# Patient Record
Sex: Male | Born: 1960 | Race: White | Hispanic: No | Marital: Single | Smoking: Current every day smoker
Health system: Southern US, Community
[De-identification: ages and names within clinical notes are randomized; demographics above are authoritative.]

## PROBLEM LIST (undated history)

## (undated) DIAGNOSIS — M109 Gout, unspecified: Secondary | ICD-10-CM

## (undated) DIAGNOSIS — I10 Essential (primary) hypertension: Secondary | ICD-10-CM

## (undated) HISTORY — PX: FOOT SURGERY: SHX648

---

## 2012-12-24 ENCOUNTER — Emergency Department: Payer: Self-pay | Admitting: Emergency Medicine

## 2013-06-21 ENCOUNTER — Emergency Department: Payer: Self-pay | Admitting: Emergency Medicine

## 2013-10-03 ENCOUNTER — Emergency Department: Payer: Self-pay | Admitting: Internal Medicine

## 2014-04-04 ENCOUNTER — Emergency Department: Payer: Self-pay | Admitting: Emergency Medicine

## 2014-04-04 LAB — CBC
HCT: 47 % (ref 40.0–52.0)
HGB: 15.5 g/dL (ref 13.0–18.0)
MCH: 28.7 pg (ref 26.0–34.0)
MCHC: 33.1 g/dL (ref 32.0–36.0)
MCV: 87 fL (ref 80–100)
Platelet: 195 10*3/uL (ref 150–440)
RBC: 5.41 10*6/uL (ref 4.40–5.90)
RDW: 13.3 % (ref 11.5–14.5)
WBC: 11 10*3/uL — AB (ref 3.8–10.6)

## 2014-04-04 LAB — COMPREHENSIVE METABOLIC PANEL
ALK PHOS: 60 U/L
Albumin: 3.7 g/dL (ref 3.4–5.0)
Anion Gap: 6 — ABNORMAL LOW (ref 7–16)
BUN: 9 mg/dL (ref 7–18)
Bilirubin,Total: 0.8 mg/dL (ref 0.2–1.0)
CHLORIDE: 106 mmol/L (ref 98–107)
CO2: 26 mmol/L (ref 21–32)
CREATININE: 1.1 mg/dL (ref 0.60–1.30)
Calcium, Total: 9.1 mg/dL (ref 8.5–10.1)
EGFR (African American): >60
EGFR (Non-African Amer.): >60
Glucose: 164 mg/dL — ABNORMAL HIGH (ref 65–99)
OSMOLALITY: 278 (ref 275–301)
Potassium: 3.9 mmol/L (ref 3.5–5.1)
SGOT(AST): 41 U/L — ABNORMAL HIGH (ref 15–37)
SGPT (ALT): 61 U/L (ref 12–78)
Sodium: 138 mmol/L (ref 136–145)
Total Protein: 7.1 g/dL (ref 6.4–8.2)

## 2014-04-06 ENCOUNTER — Emergency Department: Payer: Self-pay | Admitting: Emergency Medicine

## 2014-04-09 LAB — CULTURE, BLOOD (SINGLE)

## 2014-04-11 ENCOUNTER — Emergency Department: Payer: Self-pay | Admitting: Emergency Medicine

## 2015-02-14 ENCOUNTER — Emergency Department: Admit: 2015-02-14 | Disposition: A | Discharge status: Home / Self Care | Payer: Self-pay

## 2015-04-02 ENCOUNTER — Encounter: Payer: Self-pay | Admitting: Emergency Medicine

## 2015-04-02 ENCOUNTER — Emergency Department: Payer: Self-pay

## 2015-04-02 ENCOUNTER — Emergency Department
Admission: EM | Admit: 2015-04-02 | Discharge: 2015-04-02 | Disposition: A | Discharge status: Home / Self Care | Payer: Self-pay | Attending: Emergency Medicine | Admitting: Emergency Medicine

## 2015-04-02 DIAGNOSIS — I10 Essential (primary) hypertension: Secondary | ICD-10-CM | POA: Insufficient documentation

## 2015-04-02 DIAGNOSIS — Z720 Tobacco use: Secondary | ICD-10-CM | POA: Insufficient documentation

## 2015-04-02 DIAGNOSIS — M1712 Unilateral primary osteoarthritis, left knee: Secondary | ICD-10-CM | POA: Insufficient documentation

## 2015-04-02 DIAGNOSIS — M25562 Pain in left knee: Secondary | ICD-10-CM

## 2015-04-02 HISTORY — DX: Gout, unspecified: M10.9

## 2015-04-02 HISTORY — DX: Essential (primary) hypertension: I10

## 2015-04-02 MED ORDER — LISINOPRIL 10 MG PO TABS
ORAL_TABLET | ORAL | Status: AC
  Administered 2015-04-02: 40 mg via ORAL
  Filled 2015-04-02: qty 4

## 2015-04-02 MED ORDER — HYDROCODONE-ACETAMINOPHEN 5-325 MG PO TABS: 1.0000 | ORAL_TABLET | ORAL | Status: DC | PRN

## 2015-04-02 MED ORDER — LISINOPRIL 20 MG PO TABS: 40.0000 mg | ORAL_TABLET | Freq: Every day | ORAL | Status: DC

## 2015-04-02 MED ORDER — HYDROCODONE-ACETAMINOPHEN 5-325 MG PO TABS
ORAL_TABLET | ORAL | Status: AC
  Administered 2015-04-02: 1 via ORAL
  Filled 2015-04-02: qty 1

## 2015-04-02 MED ORDER — PREDNISONE 10 MG (21) PO TBPK: ORAL_TABLET | ORAL | Status: DC

## 2015-04-02 MED ORDER — HYDROCODONE-ACETAMINOPHEN 5-325 MG PO TABS
1.0000 | ORAL_TABLET | Freq: Once | ORAL | Status: AC
  Administered 2015-04-02: 1 via ORAL

## 2015-04-02 MED ORDER — LISINOPRIL 10 MG PO TABS
40.0000 mg | ORAL_TABLET | Freq: Once | ORAL | Status: AC
  Administered 2015-04-02: 40 mg via ORAL

## 2015-04-02 NOTE — ED Notes (Signed)
Pt states his left knee began hurting a few months ago, was seen here and treated with what sounds like a steroid. Pt here with the same knee pain and and swelling. Wrapped with ace bandage per pt. Pt walked to triage.

## 2015-04-02 NOTE — Discharge Instructions (Signed)
Hypertension Hypertension is another name for high blood pressure. High blood pressure forces your heart to work harder to pump blood. A blood pressure reading has two numbers, which includes a higher number over a lower number (example: 110/72). HOME CARE   Have your blood pressure rechecked by your doctor.  Only take medicine as told by your doctor. Follow the directions carefully. The medicine does not work as well if you skip doses. Skipping doses also puts you at risk for problems.  Do not smoke.  Monitor your blood pressure at home as told by your doctor. GET HELP IF:  You think you are having a reaction to the medicine you are taking.  You have repeat headaches or feel dizzy.  You have puffiness (swelling) in your ankles.  You have trouble with your vision. GET HELP RIGHT AWAY IF:   You get a very bad headache and are confused.  You feel weak, numb, or faint.  You get chest or belly (abdominal) pain.  You throw up (vomit).  You cannot breathe very well. MAKE SURE YOU:   Understand these instructions.  Will watch your condition.  Will get help right away if you are not doing well or get worse. Document Released: 04/04/2008 Document Revised: 10/22/2013 Document Reviewed: 08/09/2013 [facility] Patient Information 2015 [place], [place]. This information is not intended to replace advice given to you by your health care provider. Make sure you discuss any questions you have with your health care provider.  Knee Pain The knee is the complex joint between your thigh and your lower leg. It is made up of bones, tendons, ligaments, and cartilage. The bones that make up the knee are:  The femur in the thigh.  The tibia and fibula in the lower leg.  The patella or kneecap riding in the groove on the lower femur. CAUSES  Knee pain is a common complaint with many causes. A few of these causes are:  Injury, such as:  A ruptured ligament or tendon injury.  Torn  cartilage.  Medical conditions, such as:  Gout  Arthritis  Infections  Overuse, over training, or overdoing a physical activity. Knee pain can be minor or severe. Knee pain can accompany debilitating injury. Minor knee problems often respond well to self-care measures or get well on their own. More serious injuries may need medical intervention or even surgery. SYMPTOMS The knee is complex. Symptoms of knee problems can vary widely. Some of the problems are:  Pain with movement and weight bearing.  Swelling and tenderness.  Buckling of the knee.  Inability to straighten or extend your knee.  Your knee locks and you cannot straighten it.  Warmth and redness with pain and fever.  Deformity or dislocation of the kneecap. DIAGNOSIS  Determining what is wrong may be very straight forward such as when there is an injury. It can also be challenging because of the complexity of the knee. Tests to make a diagnosis may include:  Your caregiver taking a history and doing a physical exam.  Routine X-rays can be used to rule out other problems. X-rays will not reveal a cartilage tear. Some injuries of the knee can be diagnosed by:  Arthroscopy a surgical technique by which a small video camera is inserted through tiny incisions on the sides of the knee. This procedure is used to examine and repair internal knee joint problems. Tiny instruments can be used during arthroscopy to repair the torn knee cartilage (meniscus).  Arthrography is a radiology technique.  A contrast liquid is directly injected into the knee joint. Internal structures of the knee joint then become visible on X-ray film.  An MRI scan is a non X-ray radiology procedure in which magnetic fields and a computer produce two- or three-dimensional images of the inside of the knee. Cartilage tears are often visible using an MRI scanner. MRI scans have largely replaced arthrography in diagnosing cartilage tears of the  knee.  Blood work.  Examination of the fluid that helps to lubricate the knee joint (synovial fluid). This is done by taking a sample out using a needle and a syringe. TREATMENT The treatment of knee problems depends on the cause. Some of these treatments are:  Depending on the injury, proper casting, splinting, surgery, or physical therapy care will be needed.  Give yourself adequate recovery time. Do not overuse your joints. If you begin to get sore during workout routines, back off. Slow down or do fewer repetitions.  For repetitive activities such as cycling or running, maintain your strength and nutrition.  Alternate muscle groups. For example, if you are a weight lifter, work the upper body on one day and the lower body the next.  Either tight or weak muscles do not give the proper support for your knee. Tight or weak muscles do not absorb the stress placed on the knee joint. Keep the muscles surrounding the knee strong.  Take care of mechanical problems.  If you have flat feet, orthotics or special shoes may help. See your caregiver if you need help.  Arch supports, sometimes with wedges on the inner or outer aspect of the heel, can help. These can shift pressure away from the side of the knee most bothered by osteoarthritis.  A brace called an "unloader" brace also may be used to help ease the pressure on the most arthritic side of the knee.  If your caregiver has prescribed crutches, braces, wraps or ice, use as directed. The acronym for this is PRICE. This means protection, rest, ice, compression, and elevation.  Nonsteroidal anti-inflammatory drugs (NSAIDs), can help relieve pain. But if taken immediately after an injury, they may actually increase swelling. Take NSAIDs with food in your stomach. Stop them if you develop stomach problems. Do not take these if you have a history of ulcers, stomach pain, or bleeding from the bowel. Do not take without your caregiver's approval if  you have problems with fluid retention, heart failure, or kidney problems.  For ongoing knee problems, physical therapy may be helpful.  Glucosamine and chondroitin are over-the-counter dietary supplements. Both may help relieve the pain of osteoarthritis in the knee. These medicines are different from the usual anti-inflammatory drugs. Glucosamine may decrease the rate of cartilage destruction.  Injections of a corticosteroid drug into your knee joint may help reduce the symptoms of an arthritis flare-up. They may provide pain relief that lasts a few months. You may have to wait a few months between injections. The injections do have a small increased risk of infection, water retention, and elevated blood sugar levels.  Hyaluronic acid injected into damaged joints may ease pain and provide lubrication. These injections may work by reducing inflammation. A series of shots may give relief for as long as 6 months.  Topical painkillers. Applying certain ointments to your skin may help relieve the pain and stiffness of osteoarthritis. Ask your pharmacist for suggestions. Many over the-counter products are approved for temporary relief of arthritis pain.  In some countries, doctors often prescribe topical NSAIDs for  relief of chronic conditions such as arthritis and tendinitis. A review of treatment with NSAID creams found that they worked as well as oral medications but without the serious side effects. PREVENTION  Maintain a healthy weight. Extra pounds put more strain on your joints.  Get strong, stay limber. Weak muscles are a common cause of knee injuries. Stretching is important. Include flexibility exercises in your workouts.  Be smart about exercise. If you have osteoarthritis, chronic knee pain or recurring injuries, you may need to change the way you exercise. This does not mean you have to stop being active. If your knees ache after jogging or playing basketball, consider switching to  swimming, water aerobics, or other low-impact activities, at least for a few days a week. Sometimes limiting high-impact activities will provide relief.  Make sure your shoes fit well. Choose footwear that is right for your sport.  Protect your knees. Use the proper gear for knee-sensitive activities. Use kneepads when playing volleyball or laying carpet. Buckle your seat belt every time you drive. Most shattered kneecaps occur in car accidents.  Rest when you are tired. SEEK MEDICAL CARE IF:  You have knee pain that is continual and does not seem to be getting better.  SEEK IMMEDIATE MEDICAL CARE IF:  Your knee joint feels hot to the touch and you have a high fever. MAKE SURE YOU:   Understand these instructions.  Will watch your condition.  Will get help right away if you are not doing well or get worse. Document Released: 08/14/2007 Document Revised: 01/09/2012 Document Reviewed: 08/14/2007 [facility] Patient Information 2015 [place]. This information is not intended to replace advice given to you by your health care provider. Make sure you discuss any questions you have with your health care provider.   Take pain medicine as directed. Follow up with the orthopedist for further evaluation. Return to the ER for uncontrolled pain.

## 2015-04-02 NOTE — ED Provider Notes (Signed)
[facility] Emergency Department Provider Note  ____________________________________________  Time seen: Approximately 4:35 PM  I have reviewed the triage vital signs and the nursing notes.   HISTORY  Chief Complaint Knee Pain    HPI Manuel Gregory is a 54 y.o. male who presents with left knee pain, worse over the last week. He was seen here approximately 1-1/2 months ago and treated with anti-inflammatory medicine. His knee pain improved and he felt back to normal for about 3 weeks. However last week he went up and down ladders and the knee pain returned. He denies other injuries. He reports mild swelling. He has a history of varicose veins bilaterally. Also has a history of hypertension and he is currently out of his lisinopril 40 mg. He does report a mild headache today.   Past Medical History  Diagnosis Date  . Gout   . Hypertension     There are no active problems to display for this patient.   Past Surgical History  Procedure Laterality Date  . Foot surgery      Current Outpatient Rx  Name  Route  Sig  Dispense  Refill  . HYDROcodone-acetaminophen (NORCO) 5-325 MG per tablet   Oral   Take 1 tablet by mouth every 4 (four) hours as needed for moderate pain.   6 tablet   0   . lisinopril (PRINIVIL,ZESTRIL) 20 MG tablet   Oral   Take 2 tablets (40 mg total) by mouth daily.   60 tablet   0   . predniSONE (STERAPRED UNI-PAK 21 TAB) 10 MG (21) TBPK tablet      6 tablets on day 1, 5 tablets on day 2, 4 tablets on day 3, etc...   21 tablet   0     Allergies Review of patient's allergies indicates no known allergies.  No family history on file.  Social History History  Substance Use Topics  . Smoking status: Current Every Day Smoker -- 0.50 packs/day    Types: Cigarettes  . Smokeless tobacco: Never Used  . Alcohol Use: No    Review of Systems Constitutional: No fever/chills Eyes: No visual changes. ENT: No sore  throat. Cardiovascular: Denies chest pain. Respiratory: Denies shortness of breath. Gastrointestinal: No abdominal pain.  No nausea, no vomiting.  No diarrhea.  No constipation. Genitourinary: Negative for dysuria. Musculoskeletal: Negative for back pain. Skin: Negative for rash. Neurological: Negative for headaches, focal weakness or numbness.  10-point ROS otherwise negative.  ____________________________________________   PHYSICAL EXAM:  VITAL SIGNS: ED Triage Vitals  Enc Vitals Group     BP 04/02/15 1554 155/124 mmHg     Pulse --      Resp 04/02/15 1554 18     Temp 04/02/15 1554 97.3 F (36.3 C)     Temp Source 04/02/15 1554 Oral     SpO2 04/02/15 1554 98 %     Weight 04/02/15 1552 198 lb (89.812 kg)     Height 04/02/15 1552 6' (1.829 m)     Head Cir --      Peak Flow --      Pain Score 04/02/15 1552 7     Pain Loc --      Pain Edu? --      Excl. in GC? --     Constitutional: Alert and oriented. Well appearing and in no acute distress. Cardiovascular: Normal rate, regular rhythm. Grossly normal heart sounds.  Good peripheral circulation. Respiratory: Normal respiratory effort.  No retractions. Lungs  CTAB. Musculoskeletal: Knee, left:  No effusion or laxity.  nml ROM with mild crepitus.  Minimal joint line tenderness. Negative McMurray test. Negative Lachman's test. Psychiatric: Mood and affect are normal. Speech and behavior are normal.  ____________________________________________   LABS (all labs ordered are listed, but only abnormal results are displayed)  Labs Reviewed - No data to display ____________________________________________  EKG   ____________________________________________  RADIOLOGY  EXAM: LEFT KNEE - COMPLETE 4+ VIEW  COMPARISON: 10/03/2013  FINDINGS: Slight progression mild tricompartmental osteoarthritis with a small joint effusion. Normal alignment. No acute osseous finding or fracture. Patella is  located.  IMPRESSION: Mild progression of tricompartmental left knee osteoarthritis with a small joint effusion. No acute osseous finding.   Electronically Signed  By: Judie PetitM. Shick M.D.  On: 04/02/2015 16:59  ____________________________________________   PROCEDURES  Procedure(s) performed: None  Critical Care performed: No  ____________________________________________   INITIAL IMPRESSION / ASSESSMENT AND PLAN / ED COURSE  Pertinent labs & imaging results that were available during my care of the patient were reviewed by me and considered in my medical decision making (see chart for details).  Knee pain with osteoarthritis. He is given a prednisone taper and Norco No. 6. He will follow-up with Dr. Rosita KeaMenz for further evaluation. He can continue to use Ace wrap.  Hypertension. He is given a refill of his blood pressure medicine, lisinopril. ____________________________________________   FINAL CLINICAL IMPRESSION(S) / ED DIAGNOSES  Final diagnoses:  Essential hypertension  Knee pain, acute, left  Arthritis of knee, left      Ignacia BayleyRobert Duff Pozzi, PA-C 04/02/15 1717  Myrna Blazeravid Matthew Schaevitz, MD 04/03/15 [phone]

## 2015-04-04 MED ORDER — ONDANSETRON HCL 4 MG/2ML IJ SOLN
INTRAMUSCULAR | Status: AC
  Filled 2015-04-04: qty 2

## 2016-02-08 ENCOUNTER — Encounter: Payer: Self-pay | Admitting: Emergency Medicine

## 2016-02-08 ENCOUNTER — Emergency Department
Admission: EM | Admit: 2016-02-08 | Discharge: 2016-02-08 | Disposition: A | Discharge status: Home / Self Care | Payer: Self-pay | Attending: Emergency Medicine | Admitting: Emergency Medicine

## 2016-02-08 DIAGNOSIS — F17210 Nicotine dependence, cigarettes, uncomplicated: Secondary | ICD-10-CM | POA: Insufficient documentation

## 2016-02-08 DIAGNOSIS — I10 Essential (primary) hypertension: Secondary | ICD-10-CM | POA: Insufficient documentation

## 2016-02-08 DIAGNOSIS — M179 Osteoarthritis of knee, unspecified: Secondary | ICD-10-CM | POA: Insufficient documentation

## 2016-02-08 MED ORDER — OXYCODONE-ACETAMINOPHEN 5-325 MG PO TABS: 1.0000 | ORAL_TABLET | Freq: Four times a day (QID) | ORAL | Status: DC | PRN

## 2016-02-08 MED ORDER — MELOXICAM 15 MG PO TABS: 15.0000 mg | ORAL_TABLET | Freq: Every day | ORAL | Status: DC

## 2016-02-08 NOTE — ED Notes (Addendum)
Patient ambulatory to triage with steady gait, without difficulty or distress noted; pt reports bilat knee pain; st hx of same with no specific injury but pain worsening; st has been told he needs a TKR

## 2016-02-08 NOTE — ED Notes (Signed)
AAOx3.  Skin warm and dry. NAD.  Ambulates with easy and steady gait. NAD °

## 2016-02-08 NOTE — ED Provider Notes (Signed)
[facility] Emergency Department Provider Note  ____________________________________________  Time seen: Approximately 10:01 PM  I have reviewed the triage vital signs and the nursing notes.   HISTORY  Chief Complaint Knee Pain    HPI Manuel Gregory is a 55 y.o. male who presents emergency department complaining of bilateral knee pain. 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 Patient states that he knows that he has severe arthritis bilateral knees. He has been told that he needs a total knee replacement. Patient states that he normally can do with the pain however over the last several days the pain has become intolerable. Patient denies any recent injuries. Patient states that both knees are hurting no point tenderness. Pain is constant, sharp, severe.   Past Medical History  Diagnosis Date  . Gout   . Hypertension     There are no active problems to display for this patient.   Past Surgical History  Procedure Laterality Date  . Foot surgery      Current Outpatient Rx  Name  Route  Sig  Dispense  Refill  . HYDROcodone-acetaminophen (NORCO) 5-325 MG per tablet   Oral   Take 1 tablet by mouth every 4 (four) hours as needed for moderate pain.   6 tablet   0   . lisinopril (PRINIVIL,ZESTRIL) 20 MG tablet   Oral   Take 2 tablets (40 mg total) by mouth daily.   60 tablet   0   . meloxicam (MOBIC) 15 MG tablet   Oral   Take 1 tablet (15 mg total) by mouth daily.   30 tablet   0   . oxyCODONE-acetaminophen (ROXICET) 5-325 MG tablet   Oral   Take 1 tablet by mouth every 6 (six) hours as needed for severe pain.   20 tablet   0   . predniSONE (STERAPRED UNI-PAK 21 TAB) 10 MG (21) TBPK tablet      6 tablets on day 1, 5 tablets on day 2, 4 tablets on day 3, etc...   21 tablet   0     Allergies Shellfish allergy  No family history on file.  Social History Social History  Substance Use  Topics  . Smoking status: Current Every Day Smoker -- 0.50 packs/day    Types: Cigarettes  . Smokeless tobacco: Never Used  . Alcohol Use: No     Review of Systems  Constitutional: No fever/chills Cardiovascular: no chest pain. Respiratory: no cough. No SOB. Musculoskeletal: Positive for bilateral knee pain. Skin: Negative for rash. Neurological: Negative for headaches, focal weakness or numbness. 10-point ROS otherwise negative.  ____________________________________________   PHYSICAL EXAM:  VITAL SIGNS: ED Triage Vitals  Enc Vitals Group     BP 02/08/16 2156 180/100 mmHg     Pulse Rate 02/08/16 2156 72     Resp 02/08/16 2156 20     Temp 02/08/16 2156 97.9 F (36.6 C)     Temp Source 02/08/16 2156 Oral     SpO2 02/08/16 2156 97 %     Weight 02/08/16 2156 210 lb (95.255 kg)     Height 02/08/16 2156 6' (1.829 m)     Head Cir --      Peak Flow --      Pain Score 02/08/16 2155 8     Pain Loc --      Pain Edu? --      Excl. in GC? --  Constitutional: Alert and oriented. Well appearing and in no acute distress. Cardiovascular: Normal rate, regular rhythm. Normal S1 and S2.  Good peripheral circulation. Respiratory: Normal respiratory effort without tachypnea or retractions. Lungs CTAB. Musculoskeletal: No deformity to bilateral knees upon inspection. No edema noted. No ballottement noted. Full range of motion to bilateral knees. Varus, valgus, Lachman's are negative bilaterally. Patient is diffusely tender to palpation over the entire knee but no point tenderness. Sensation intact bilateral lower extremities. Dorsalis pedis pulse intact bilateral lower extremities. Neurologic:  Normal speech and language. No gross focal neurologic deficits are appreciated.  Skin:  Skin is warm, dry and intact. No rash noted. Psychiatric: Mood and affect are normal. Speech and behavior are normal. Patient exhibits appropriate insight and  judgement.   ____________________________________________   LABS (all labs ordered are listed, but only abnormal results are displayed)  Labs Reviewed - No data to display ____________________________________________  EKG   ____________________________________________  RADIOLOGY  Knee x-rays from the last 2 years are reviewed. Significant amounts of osteoarthritis are visualized.  No results found.  ____________________________________________    PROCEDURES  Procedure(s) performed:       Medications - No data to display   ____________________________________________   INITIAL IMPRESSION / ASSESSMENT AND PLAN / ED COURSE  Pertinent labs & imaging results that were available during my care of the patient were reviewed by me and considered in my medical decision making (see chart for details).  Patient's diagnosis is consistent with bilateral osteoarthritis of the knees. No recent injury and no new labs are ordered.. Patient will be discharged home with prescriptions for anti-inflammatories and limited pain medication.. Patient is to follow up with orthopedics for further discussion for total knee replacement for definitive treatment of this AM. Patient is given ED precautions to return to the ED for any worsening or new symptoms.     ____________________________________________  FINAL CLINICAL IMPRESSION(S) / ED DIAGNOSES  Final diagnoses:  Unilateral osteoarthritis of knee, unspecified osteoarthritis type      NEW MEDICATIONS STARTED DURING THIS VISIT:  New Prescriptions   MELOXICAM (MOBIC) 15 MG TABLET    Take 1 tablet (15 mg total) by mouth daily.   OXYCODONE-ACETAMINOPHEN (ROXICET) 5-325 MG TABLET    Take 1 tablet by mouth every 6 (six) hours as needed for severe pain.        This chart was dictated using voice recognition software/Dragon. Despite best efforts to proofread, errors can occur which can change the meaning. Any change was purely  unintentional.    Racheal PatchesJonathan D Sidni Fusco, PA-C 02/08/16 2214  Phineas SemenGraydon Goodman, MD 02/08/16 [phone]

## 2016-02-08 NOTE — Discharge Instructions (Signed)

## 2016-02-19 ENCOUNTER — Emergency Department: Payer: Self-pay

## 2016-02-19 ENCOUNTER — Emergency Department
Admission: EM | Admit: 2016-02-19 | Discharge: 2016-02-19 | Disposition: A | Discharge status: Home / Self Care | Payer: Self-pay | Attending: Emergency Medicine | Admitting: Emergency Medicine

## 2016-02-19 ENCOUNTER — Encounter: Payer: Self-pay | Admitting: Emergency Medicine

## 2016-02-19 DIAGNOSIS — I10 Essential (primary) hypertension: Secondary | ICD-10-CM | POA: Insufficient documentation

## 2016-02-19 DIAGNOSIS — M109 Gout, unspecified: Secondary | ICD-10-CM | POA: Insufficient documentation

## 2016-02-19 DIAGNOSIS — M7989 Other specified soft tissue disorders: Secondary | ICD-10-CM | POA: Insufficient documentation

## 2016-02-19 DIAGNOSIS — F17210 Nicotine dependence, cigarettes, uncomplicated: Secondary | ICD-10-CM | POA: Insufficient documentation

## 2016-02-19 DIAGNOSIS — S86112A Strain of other muscle(s) and tendon(s) of posterior muscle group at lower leg level, left leg, initial encounter: Secondary | ICD-10-CM

## 2016-02-19 DIAGNOSIS — Z7952 Long term (current) use of systemic steroids: Secondary | ICD-10-CM | POA: Insufficient documentation

## 2016-02-19 LAB — COMPREHENSIVE METABOLIC PANEL
ALT: 58 U/L (ref 17–63)
ANION GAP: 5 (ref 5–15)
AST: 48 U/L — ABNORMAL HIGH (ref 15–41)
Albumin: 4.6 g/dL (ref 3.5–5.0)
Alkaline Phosphatase: 55 U/L (ref 38–126)
BUN: 10 mg/dL (ref 6–20)
CO2: 32 mmol/L (ref 22–32)
Calcium: 9.2 mg/dL (ref 8.9–10.3)
Chloride: 99 mmol/L — ABNORMAL LOW (ref 101–111)
Creatinine, Ser: 1.11 mg/dL (ref 0.61–1.24)
GFR calc non Af Amer: >60 mL/min (ref >60)
Glucose, Bld: 99 mg/dL (ref 65–99)
POTASSIUM: 4.3 mmol/L (ref 3.5–5.1)
SODIUM: 136 mmol/L (ref 135–145)
Total Bilirubin: 1.4 mg/dL — ABNORMAL HIGH (ref 0.3–1.2)
Total Protein: 7.6 g/dL (ref 6.5–8.1)

## 2016-02-19 LAB — CBC
HCT: 43.5 % (ref 40.0–52.0)
Hemoglobin: 14.5 g/dL (ref 13.0–18.0)
MCH: 28.1 pg (ref 26.0–34.0)
MCHC: 33.4 g/dL (ref 32.0–36.0)
MCV: 84.2 fL (ref 80.0–100.0)
Platelets: 218 10*3/uL (ref 150–440)
RBC: 5.16 MIL/uL (ref 4.40–5.90)
RDW: 13.9 % (ref 11.5–14.5)
WBC: 13.5 10*3/uL — ABNORMAL HIGH (ref 3.8–10.6)

## 2016-02-19 MED ORDER — HYDROCODONE-ACETAMINOPHEN 5-325 MG PO TABS
1.0000 | ORAL_TABLET | Freq: Once | ORAL | Status: AC
  Administered 2016-02-19: 1 via ORAL
  Filled 2016-02-19: qty 1

## 2016-02-19 MED ORDER — LISINOPRIL 20 MG PO TABS
40.0000 mg | ORAL_TABLET | Freq: Every day | ORAL | Status: DC
Start: 2016-02-19 — End: 2016-07-21

## 2016-02-19 MED ORDER — HYDROCODONE-ACETAMINOPHEN 5-325 MG PO TABS: 1.0000 | ORAL_TABLET | ORAL | Status: DC | PRN

## 2016-02-19 MED ORDER — IOPAMIDOL (ISOVUE-370) INJECTION 76%
125.0000 mL | Freq: Once | INTRAVENOUS | Status: AC | PRN
  Administered 2016-02-19: 125 mL via INTRAVENOUS

## 2016-02-19 NOTE — ED Notes (Addendum)
Pt t triage via w/c with no distress noted; pt reports sudden swelling & pain to right lower leg since 8am, increased last 3hrs; recent right knee problems and dx with need for knee replacement; pt c/o generalized HA x hr

## 2016-02-19 NOTE — ED Notes (Signed)
Patient transported to Ultrasound 

## 2016-02-19 NOTE — ED Provider Notes (Signed)
[facility] Emergency Department Provider Note  ____________________________________________    I have reviewed the triage vital signs and the nursing notes.   HISTORY  Chief Complaint Leg Swelling    HPI Manuel Gregory is a 55 y.o. male who presents with complaints of lower leg swelling. Patient reports gradually worsening swelling of the right lower legsince this morning. He does report pain as well. He denies traumatic injury but does report that he miss stepped last night while walking through his house. No history of blood clots. No shortness of breath. No pleurisy. No recent travel.     Past Medical History  Diagnosis Date  . Gout   . Hypertension     There are no active problems to display for this patient.   Past Surgical History  Procedure Laterality Date  . Foot surgery      Current Outpatient Rx  Name  Route  Sig  Dispense  Refill  . HYDROcodone-acetaminophen (NORCO) 5-325 MG per tablet   Oral   Take 1 tablet by mouth every 4 (four) hours as needed for moderate pain.   6 tablet   0   . lisinopril (PRINIVIL,ZESTRIL) 20 MG tablet   Oral   Take 2 tablets (40 mg total) by mouth daily.   60 tablet   0   . meloxicam (MOBIC) 15 MG tablet   Oral   Take 1 tablet (15 mg total) by mouth daily.   30 tablet   0   . oxyCODONE-acetaminophen (ROXICET) 5-325 MG tablet   Oral   Take 1 tablet by mouth every 6 (six) hours as needed for severe pain.   20 tablet   0   . predniSONE (STERAPRED UNI-PAK 21 TAB) 10 MG (21) TBPK tablet      6 tablets on day 1, 5 tablets on day 2, 4 tablets on day 3, etc...   21 tablet   0     Allergies Shellfish allergy  No family history on file.  Social History Social History  Substance Use Topics  . Smoking status: Current Every Day Smoker -- 0.50 packs/day    Types: Cigarettes  . Smokeless tobacco: Never Used  . Alcohol Use: No    Review of Systems  Constitutional: Negative for  fever. Eyes: Negative for redness  Cardiovascular: Negative for chest pain Respiratory: Negative for shortness of breath. Gastrointestinal: Negative for abdominal pain  Musculoskeletal: Negative for back pain.Calf pain as above Skin: Negative for pallor Neurological: Negative for numbness or tingling Psychiatric: Mild anxiety    ____________________________________________   PHYSICAL EXAM:  VITAL SIGNS: ED Triage Vitals  Enc Vitals Group     BP 02/19/16 0151 170/120 mmHg     Pulse Rate 02/19/16 0151 89     Resp 02/19/16 0151 20     Temp 02/19/16 0151 98.6 F (37 C)     Temp Source 02/19/16 0151 Oral     SpO2 02/19/16 0151 97 %     Weight 02/19/16 0151 210 lb (95.255 kg)     Height 02/19/16 0151 6' (1.829 m)     Head Cir --      Peak Flow --      Pain Score 02/19/16 0148 10     Pain Loc --      Pain Edu? --      Excl. in GC? --      Constitutional: Alert and oriented. Well appearing and in no distress.  Eyes: Conjunctivae are normal. No erythema  or injection ENT   Head: Normocephalic and atraumatic.   Mouth/Throat: Mucous membranes are moist. Cardiovascular: Normal rate, regular rhythm. Normal and symmetric distal pulses are present in the upper extremities.  Respiratory: Normal respiratory effort without tachypnea nor retractions. Breath sounds are clear and equal bilaterally.  Gastrointestinal: Soft and non-tender in all quadrants. No distention. There is no CVA tenderness. Genitourinary: deferred Musculoskeletal: Nontender with normal range of motion in all extremities. Right lower extremity: Calf is swollen diffusely but actually is warm and well perfused. 2+ distal pulses. Compartments are soft, no pain with extension or flexion of the foot. No pallor. No pain out of portion to exam Neurologic:  Normal speech and language. No gross focal neurologic deficits are appreciated. Skin:  Skin is warm, dry and intact. No rash noted. Psychiatric: Mood and affect  are normal. Patient exhibits appropriate insight and judgment.  ____________________________________________    LABS (pertinent positives/negatives)  Labs Reviewed - No data to display  ____________________________________________   EKG  None  ____________________________________________    RADIOLOGY  CT scan shows findings consistent with muscle tear and hematoma  ____________________________________________   PROCEDURES  Procedure(s) performed: none  Critical Care performed: none  ____________________________________________   INITIAL IMPRESSION / ASSESSMENT AND PLAN / ED COURSE  Pertinent labs & imaging results that were available during my care of the patient were reviewed by me and considered in my medical decision making (see chart for details).  Patient presents with swelling of the right lower leg. I'm suspicious of possible muscle injury/hematoma but blood clot is also possible. We will sent for ultrasound initially  Radiologist called regarding ultrasound, unclear findings suspect possible muscle injury with hematoma but requests CT angio of the extremity  ----------------------------------------- 5:46 AM on 02/19/2016 -----------------------------------------  CT angio most consistent with muscle injury with hematoma, this is consistent with history of present illness as well. No definitive DVT and given hematoma not appropriate for anticoagulation now. Patient will require close follow-up with ortho. We will place him on crutches and provide analgesics. Compartments rechecked, while the extremity is swollen no evidence of compartment syndrome. I discussed with patient concerning signs and symptoms to look out for and return precautions ____________________________________________   FINAL CLINICAL IMPRESSION(S) / ED DIAGNOSES  Final diagnoses:  Calf swelling  Gastrocnemius muscle tear, left, initial encounter          Jene Every,  MD 02/19/16 [phone]

## 2016-05-02 ENCOUNTER — Other Ambulatory Visit: Payer: Self-pay | Admitting: Obstetrics and Gynecology

## 2016-05-02 ENCOUNTER — Ambulatory Visit
Admission: RE | Admit: 2016-05-02 | Discharge: 2016-05-02 | Disposition: A | Discharge status: Home / Self Care | Payer: Disability Insurance | Source: Ambulatory Visit | Attending: Obstetrics and Gynecology | Admitting: Obstetrics and Gynecology

## 2016-05-02 DIAGNOSIS — M549 Dorsalgia, unspecified: Secondary | ICD-10-CM | POA: Diagnosis not present

## 2016-05-02 DIAGNOSIS — M5136 Other intervertebral disc degeneration, lumbar region: Secondary | ICD-10-CM | POA: Diagnosis not present

## 2016-05-02 DIAGNOSIS — M50322 Other cervical disc degeneration at C5-C6 level: Secondary | ICD-10-CM | POA: Insufficient documentation

## 2016-05-02 DIAGNOSIS — M25461 Effusion, right knee: Secondary | ICD-10-CM

## 2016-05-02 DIAGNOSIS — M1711 Unilateral primary osteoarthritis, right knee: Secondary | ICD-10-CM | POA: Diagnosis not present

## 2016-05-02 DIAGNOSIS — R29890 Loss of height: Secondary | ICD-10-CM | POA: Insufficient documentation

## 2016-07-21 ENCOUNTER — Encounter: Payer: Self-pay | Admitting: Emergency Medicine

## 2016-07-21 ENCOUNTER — Emergency Department
Admission: EM | Admit: 2016-07-21 | Discharge: 2016-07-21 | Disposition: A | Discharge status: Home / Self Care | Payer: Disability Insurance | Attending: Emergency Medicine | Admitting: Emergency Medicine

## 2016-07-21 DIAGNOSIS — I10 Essential (primary) hypertension: Secondary | ICD-10-CM

## 2016-07-21 DIAGNOSIS — F17210 Nicotine dependence, cigarettes, uncomplicated: Secondary | ICD-10-CM | POA: Insufficient documentation

## 2016-07-21 DIAGNOSIS — M62838 Other muscle spasm: Secondary | ICD-10-CM | POA: Insufficient documentation

## 2016-07-21 LAB — TROPONIN I

## 2016-07-21 LAB — CBC WITH DIFFERENTIAL/PLATELET
BASOS ABS: 0.1 10*3/uL (ref 0–0.1)
Basophils Relative: 1 %
EOS ABS: 0.3 10*3/uL (ref 0–0.7)
EOS PCT: 4 %
HCT: 45.3 % (ref 40.0–52.0)
Hemoglobin: 15.6 g/dL (ref 13.0–18.0)
Lymphocytes Relative: 37 %
Lymphs Abs: 2.7 10*3/uL (ref 1.0–3.6)
MCH: 28.5 pg (ref 26.0–34.0)
MCHC: 34.4 g/dL (ref 32.0–36.0)
MCV: 82.7 fL (ref 80.0–100.0)
Monocytes Absolute: 0.7 10*3/uL (ref 0.2–1.0)
Monocytes Relative: 10 %
Neutro Abs: 3.4 10*3/uL (ref 1.4–6.5)
Neutrophils Relative %: 48 %
PLATELETS: 182 10*3/uL (ref 150–440)
RBC: 5.48 MIL/uL (ref 4.40–5.90)
RDW: 14.5 % (ref 11.5–14.5)
WBC: 7.2 10*3/uL (ref 3.8–10.6)

## 2016-07-21 LAB — COMPREHENSIVE METABOLIC PANEL
ALT: 70 U/L — AB (ref 17–63)
AST: 57 U/L — AB (ref 15–41)
Albumin: 4.3 g/dL (ref 3.5–5.0)
Alkaline Phosphatase: 55 U/L (ref 38–126)
Anion gap: 6 (ref 5–15)
BILIRUBIN TOTAL: 0.7 mg/dL (ref 0.3–1.2)
BUN: 10 mg/dL (ref 6–20)
CALCIUM: 9.4 mg/dL (ref 8.9–10.3)
CO2: 28 mmol/L (ref 22–32)
Chloride: 105 mmol/L (ref 101–111)
Creatinine, Ser: 0.84 mg/dL (ref 0.61–1.24)
GLUCOSE: 108 mg/dL — AB (ref 65–99)
POTASSIUM: 4.4 mmol/L (ref 3.5–5.1)
SODIUM: 139 mmol/L (ref 135–145)
Total Protein: 7.5 g/dL (ref 6.5–8.1)

## 2016-07-21 MED ORDER — LISINOPRIL 20 MG PO TABS: 40.0000 mg | ORAL_TABLET | Freq: Every day | ORAL | 0 refills | Status: DC

## 2016-07-21 MED ORDER — CYCLOBENZAPRINE HCL 5 MG PO TABS: 5.0000 mg | ORAL_TABLET | Freq: Three times a day (TID) | ORAL | 0 refills | Status: DC | PRN

## 2016-07-21 NOTE — ED Provider Notes (Signed)
[facility] Emergency Department Provider Note  ____________________________________________   I have reviewed the triage vital signs and the nursing notes.   HISTORY  Chief Complaint Shoulder pain   HPI Manuel Gregory is a 55 y.o. male with a history of uncontrolled hypertension has not been on his blood pressure medications for years. Routinely, we see himhis blood pressure is diastolic over 100 sometimes up to 120. Patient also is very high systolic blood pressures. He knows he needs to get his blood pressure control. He is here today however because he was picking up something heavy over the weekend. He helped his landlord moved furniture and install sinks. This resulted in unwonted exertion of his shoulder muscles. The next morning, he woke up with a "soreness" in his trapezius muscles bilaterally. Hurts when he changes position or when he moves. To me he denies headache. He states it feels a cold muscle. He gets better when he takes Motrin or daily patter. He has he states no chest pain or shortness of breath no numbness no weakness to me he denies any headache. He states that he is pressure he pulled a muscle. The pain has been constant since 4 days ago. There is been no associated radiation or other complaints. The pain is not exertional, it hurts when he lifts his arms up, hurts when he lies the wrong way in bed.      Past Medical History:  Diagnosis Date  . Gout   . Hypertension     There are no active problems to display for this patient.   Past Surgical History:  Procedure Laterality Date  . FOOT SURGERY      Prior to Admission medications   Medication Sig Start Date End Date Taking? Authorizing Provider  HYDROcodone-acetaminophen (NORCO/VICODIN) 5-325 MG tablet Take 1 tablet by mouth every 4 (four) hours as needed for moderate pain. 02/19/16   Jene Every, MD  lisinopril (PRINIVIL,ZESTRIL) 20 MG tablet Take 2 tablets (40 mg total) by mouth  daily. 02/19/16 02/18/17  Jene Every, MD  meloxicam (MOBIC) 15 MG tablet Take 1 tablet (15 mg total) by mouth daily. 02/08/16   Delorise Royals Cuthriell, PA-C  oxyCODONE-acetaminophen (ROXICET) 5-325 MG tablet Take 1 tablet by mouth every 6 (six) hours as needed for severe pain. 02/08/16   Delorise Royals Cuthriell, PA-C  predniSONE (STERAPRED UNI-PAK 21 TAB) 10 MG (21) TBPK tablet 6 tablets on day 1, 5 tablets on day 2, 4 tablets on day 3, etc... 04/02/15   Ignacia Bayley, PA-C    Allergies Shellfish allergy  History reviewed. No pertinent family history.  Social History Social History  Substance Use Topics  . Smoking status: Current Every Day Smoker    Packs/day: 0.50    Types: Cigarettes  . Smokeless tobacco: Never Used  . Alcohol use No    Review of Systems Constitutional: No fever/chills Eyes: No visual changes. ENT: No sore throat. No stiff neck no neck pain Cardiovascular: Denies chest pain. Respiratory: Denies shortness of breath. Gastrointestinal:   no vomiting.  No diarrhea.  No constipation. Genitourinary: Negative for dysuria. Musculoskeletal: Negative lower extremity swelling Skin: Negative for rash. Neurological: Negative for severe headaches, focal weakness or numbness. 10-point ROS otherwise negative.  ____________________________________________   PHYSICAL EXAM:  VITAL SIGNS: ED Triage Vitals [07/21/16 1318]  Enc Vitals Group     BP (!) 204/117     Pulse Rate (!) 57     Resp 16     Temp 98.1 F (  36.7 C)     Temp Source Oral     SpO2 98 %     Weight 195 lb (88.5 kg)     Height 6' (1.829 m)     Head Circumference      Peak Flow      Pain Score 8     Pain Loc      Pain Edu?      Excl. in GC?     Constitutional: Alert and oriented. Well appearing and in no acute distress. Eyes: Conjunctivae are normal. PERRL. EOMI. Head: Atraumatic. Nose: No congestion/rhinnorhea. Mouth/Throat: Mucous membranes are moist.  Oropharynx non-erythematous. Neck: No stridor.    he has reproduce will tenderness to palpation in the trapezius muscles bilaterally as well as in the rhomboids. This elicits his pain. They have patient also causes pain by moving his head to the left or to the right. There is no evidence of shingles, there is no meningismus. Cardiovascular: Normal rate, regular rhythm. Grossly normal heart sounds.  Good peripheral circulation. Respiratory: Normal respiratory effort.  No retractions. Lungs CTAB. Abdominal: Soft and nontender. No distention. No guarding no rebound Back:  There is no focal tenderness or step off.  there is no midline tenderness there are no lesions noted. there is no CVA tenderness Musculoskeletal: No lower extremity tenderness, no upper extremity tenderness. No joint effusions, no DVT signs strong distal pulses no edema Neurologic:  Normal speech and language. No gross focal neurologic deficits are appreciated.  Skin:  Skin is warm, dry and intact. No rash noted. Psychiatric: Mood and affect are normal. Speech and behavior are normal.  ____________________________________________   LABS (all labs ordered are listed, but only abnormal results are displayed)  Labs Reviewed  COMPREHENSIVE METABOLIC PANEL - Abnormal; Notable for the following:       Result Value   Glucose, Bld 108 (*)    AST 57 (*)    ALT 70 (*)    All other components within normal limits  CBC WITH DIFFERENTIAL/PLATELET   ____________________________________________  EKG  I personally interpreted any EKGs ordered by me or triage Sinus rhythm rate 67 bpm LVH noted no acute ischemia, normal axis ____________________________________________  RADIOLOGY  I reviewed any imaging ordered by me or triage that were performed during my shift and, if possible, patient and/or family made aware of any abnormal findings. ____________________________________________   PROCEDURES  Procedure(s) performed: None  Procedures  Critical Care performed:  None  ____________________________________________   INITIAL IMPRESSION / ASSESSMENT AND PLAN / ED COURSE  Pertinent labs & imaging results that were available during my care of the patient were reviewed by me and considered in my medical decision making (see chart for details).  Patient with chronically uncontrolled blood pressure is actually at his baseline or near to it, presents  with what is clearly muscular skeletal injury with a clear inciting activity. The question, therefore, is whether not there is a concomitant other pathology. His blood pressure is of concern however, this is his baseline. Very low suspicion for PE ACS dissection or head bleed. I have offered the patient further workup for these entities including chest x-ray and CT of the head but he refuses. He states "I know I just pulled my muscles are remember doing it". Given that he refuses further workup and is a very convincing at exam and history for muscle skeletal pain we will treat him as such. I have counseled him extensively about his years long neglect of his blood pressure.  Patient does have some evidence of LVH on his EKG which is not surprising given his chronic history of untreated hypertension. Given that he declines further workup and is convinced of his explanation for the disease process at hand, which I find also to be convincing (although I would personally prefer to do a chest x-ray which he has declined,) we will discharge him. I will restart him on his lisinopril which she states is 40 mg a day, and we'll have him follow-up as an outpatient. Extensive return precautions and follow-up given and understood.  Clinical Course   ____________________________________________   FINAL CLINICAL IMPRESSION(S) / ED DIAGNOSES  Final diagnoses:  None      This chart was dictated using voice recognition software.  Despite best efforts to proofread,  errors can occur which can change meaning.      Jeanmarie Plant,  MD 07/21/16 1444

## 2016-07-21 NOTE — Discharge Instructions (Signed)
Do not drive on flexeril. He has pain in her shoulders. Sometimes, this can signify more significant and disease than simply muscle pain. However, at this time, he would prefer not have chest x-rays and CT scans which is not unreasonable but does limit our ability to evaluate some of the possible causes. If you have therefore any new or worrisome symptoms or you change your mind we feel worse please return to the emergency department. Your blood pressure is been high for several years and it is going to start to have a very serious negative effect on your life if you're not careful and he do not take To get it under control.. We already see some element of heart strain on your EKG. We ask that you follow closely with her primary care doctor and the take blood pressure medications as prescribed.

## 2016-07-21 NOTE — ED Triage Notes (Signed)
Pt presents to ED with c/o Headache, neck pain, and shoulder pain. Pt denies injury, denies fever, states pain is on both shoulders. Pt is able to touch his chin to his chest. Pt states hx of hypertension and has not been taking his medications. Pt BP in triage is 204/117. Pt is alert and oriented at this time, no acuted distress noted at this time. Facial symmetry intact, grip strengths equal, denies any numbness or tingling.

## 2017-08-01 ENCOUNTER — Telehealth: Payer: Self-pay | Admitting: Adult Health Nurse Practitioner

## 2017-08-01 NOTE — Telephone Encounter (Signed)
Wants to be new patient- call back

## 2018-03-22 ENCOUNTER — Emergency Department
Admission: EM | Admit: 2018-03-22 | Discharge: 2018-03-22 | Disposition: A | Discharge status: Home / Self Care | Payer: Medicare Other | Attending: Student in an Organized Health Care Education/Training Program | Admitting: Student in an Organized Health Care Education/Training Program

## 2018-03-22 ENCOUNTER — Encounter: Payer: Self-pay | Admitting: Emergency Medicine

## 2018-03-22 DIAGNOSIS — I10 Essential (primary) hypertension: Secondary | ICD-10-CM | POA: Diagnosis not present

## 2018-03-22 DIAGNOSIS — F17210 Nicotine dependence, cigarettes, uncomplicated: Secondary | ICD-10-CM | POA: Insufficient documentation

## 2018-03-22 DIAGNOSIS — S60562A Insect bite (nonvenomous) of left hand, initial encounter: Secondary | ICD-10-CM | POA: Insufficient documentation

## 2018-03-22 DIAGNOSIS — Y929 Unspecified place or not applicable: Secondary | ICD-10-CM | POA: Diagnosis not present

## 2018-03-22 DIAGNOSIS — Y999 Unspecified external cause status: Secondary | ICD-10-CM | POA: Insufficient documentation

## 2018-03-22 DIAGNOSIS — W57XXXA Bitten or stung by nonvenomous insect and other nonvenomous arthropods, initial encounter: Secondary | ICD-10-CM | POA: Diagnosis not present

## 2018-03-22 DIAGNOSIS — Y939 Activity, unspecified: Secondary | ICD-10-CM | POA: Insufficient documentation

## 2018-03-22 MED ORDER — PREDNISONE 20 MG PO TABS
60.0000 mg | ORAL_TABLET | Freq: Once | ORAL | Status: AC
  Administered 2018-03-22: 60 mg via ORAL
  Filled 2018-03-22: qty 3

## 2018-03-22 MED ORDER — LISINOPRIL 20 MG PO TABS: 40.0000 mg | ORAL_TABLET | Freq: Every day | ORAL | 1 refills | Status: DC

## 2018-03-22 MED ORDER — PREDNISONE 10 MG (21) PO TBPK: ORAL_TABLET | ORAL | 0 refills | Status: DC

## 2018-03-22 NOTE — ED Notes (Signed)
Pt ambulatory to POV without difficulty. VSS. NAD. Discharge instruction, RX and follow up given. All questions addressed.

## 2018-03-22 NOTE — ED Notes (Signed)
Patient reports working in yard yesterday and noticing left hand was red with minimal swelling. States today hand has continued to swell and get more red. Reports over the last few hours he has developed body aches and sweats. Patient's left hand is noticeably red and swollen to wrist. Patient reports small scab to wrist above swelling. Patient able to move fingers and hand without difficulty.

## 2018-03-22 NOTE — Discharge Instructions (Addendum)
Your exam is consistent with an insect bite to the left hand. Take the prescription meds as directed. Follow-up with your provider for management of your blood pressure. Apply ice to reduce swelling to the hand.

## 2018-03-22 NOTE — ED Triage Notes (Signed)
Pt report redness and swelling to his left hand for a few days. Pt states that he has been doing a lot of yard work and thinks he was bitten by an insect or something. Pt reports area hurts. Redness and swelling noted.

## 2018-03-23 NOTE — ED Provider Notes (Signed)
[facility] Emergency Department Provider Note ____________________________________________  Time seen: 2129  I have reviewed the triage vital signs and the nursing notes.  HISTORY  Chief Complaint  Hand Problem and Insect Bite  HPI Manuel Gregory is a 57 y.o. male presents himself to the ED for evaluation of swelling to the left hand.  Patient recalls being outside working a few days ago.  He thinks he was bitten or stung by an insect but is unclear what it was.  He describes the last 2 to 3 days he has had swelling and redness to the left hand.  He reports pain to the dorsal aspect of the hand but denies any open wounds or drainage.  He also denies any history of wasp or bee anaphylaxis.  Patient has not been treated in the hand with any particular medications he continue to work around the house in the interim.  He presents now for evaluation management of swelling and pain to the left hand consistent with a suspected insect bite/sting.  Past Medical History:  Diagnosis Date  . Gout   . Hypertension     There are no active problems to display for this patient.   Past Surgical History:  Procedure Laterality Date  . FOOT SURGERY      Prior to Admission medications   Medication Sig Start Date End Date Taking? Authorizing Provider  lisinopril (PRINIVIL,ZESTRIL) 20 MG tablet Take 2 tablets (40 mg total) by mouth daily. 03/22/18 05/21/18  Aaria Happ, Charlesetta Ivory, PA-C  predniSONE (STERAPRED UNI-PAK 21 TAB) 10 MG (21) TBPK tablet 6-day taper as directed. 03/22/18   Tekisha Darcey, Charlesetta Ivory, PA-C    Allergies Shellfish allergy  No family history on file.  Social History Social History   Tobacco Use  . Smoking status: Current Every Day Smoker    Packs/day: 0.50    Types: Cigarettes  . Smokeless tobacco: Never Used  Substance Use Topics  . Alcohol use: No  . Drug use: No    Review of Systems  Constitutional: Negative for fever. Eyes: Negative for  visual changes. ENT: Negative for sore throat. Cardiovascular: Negative for chest pain. Respiratory: Negative for shortness of breath. Musculoskeletal: Negative for back pain. Skin: Negative for rash.  Left hand redness and swelling as above. Neurological: Negative for headaches, focal weakness or numbness. ____________________________________________  PHYSICAL EXAM:  VITAL SIGNS: ED Triage Vitals  Enc Vitals Group     BP 03/22/18 2001 (!) 192/117     Pulse Rate 03/22/18 2001 71     Resp 03/22/18 2001 20     Temp 03/22/18 2001 98.4 F (36.9 C)     Temp Source 03/22/18 2001 Oral     SpO2 03/22/18 2001 97 %     Weight 03/22/18 2002 195 lb (88.5 kg)     Height 03/22/18 2002 6' (1.829 m)     Head Circumference --      Peak Flow --      Pain Score 03/22/18 2002 8     Pain Loc --      Pain Edu? --      Excl. in GC? --     Constitutional: Alert and oriented. Well appearing and in no distress. Head: Normocephalic and atraumatic. Eyes: Conjunctivae are normal. Normal extraocular movements Hematological/Lymphatic/Immunological: No epitrochlear or axillary lymphadenopathy on the left.. Cardiovascular: Normal rate, regular rhythm. Normal distal pulses. Respiratory: Normal respiratory effort. No wheezes/rales/rhonchi. Gastrointestinal: Soft and nontender. No distention. Musculoskeletal: Normal composite fist on  the left.  Nontender with normal range of motion in all extremities.  Neurologic:  Normal sensation.  Normal speech and language. No gross focal neurologic deficits are appreciated. Skin:  Skin is warm, dry and intact. No rash noted.  Left hand with dorsal swelling and edema consistent with a local allergic reaction.  Patient with a superficial scab to the hand consistent with where he notes the initial bite or sting.  No lymphangitis or streaking is appreciated.  Open wounds, abscess, sialitis is  noted. ____________________________________________  PROCEDURES  Procedures Prednisone 60 mg PO ____________________________________________  INITIAL IMPRESSION / ASSESSMENT AND PLAN / ED COURSE  ED evaluation of left hand swelling and erythema consistent with a likely local allergic reaction.  Patient apparently sustained an insect bite or sting 2 days prior.  He has no signs of any cellulitis or abscess formation.  Patient will be discharged with a prescription for prednisone to dose as directed.  He is advised to rest, and ice the hand as necessary.  Return precautions have been reviewed.  He is also given a prescription for lisinopril as he has been out of his medication for the last several months due to a lapse in his insurance coverage.  Patient will follow-up with local community clinic for ongoing management. ____________________________________________  FINAL CLINICAL IMPRESSION(S) / ED DIAGNOSES  Final diagnoses:  Insect bite of left hand, initial encounter  Uncontrolled hypertension      Briston Lax, Charlesetta Ivory, PA-C 03/23/18 0040    Willy Eddy, MD 03/27/18 1015

## 2018-04-04 ENCOUNTER — Encounter: Payer: Self-pay | Admitting: Emergency Medicine

## 2018-04-04 ENCOUNTER — Other Ambulatory Visit: Payer: Self-pay

## 2018-04-04 DIAGNOSIS — L02512 Cutaneous abscess of left hand: Secondary | ICD-10-CM | POA: Diagnosis not present

## 2018-04-04 DIAGNOSIS — W57XXXA Bitten or stung by nonvenomous insect and other nonvenomous arthropods, initial encounter: Secondary | ICD-10-CM | POA: Diagnosis present

## 2018-04-04 DIAGNOSIS — Z66 Do not resuscitate: Secondary | ICD-10-CM | POA: Diagnosis present

## 2018-04-04 DIAGNOSIS — L03114 Cellulitis of left upper limb: Secondary | ICD-10-CM | POA: Diagnosis present

## 2018-04-04 DIAGNOSIS — I10 Essential (primary) hypertension: Secondary | ICD-10-CM | POA: Diagnosis present

## 2018-04-04 DIAGNOSIS — F17210 Nicotine dependence, cigarettes, uncomplicated: Secondary | ICD-10-CM | POA: Diagnosis present

## 2018-04-04 DIAGNOSIS — Z91013 Allergy to seafood: Secondary | ICD-10-CM

## 2018-04-04 DIAGNOSIS — Z79899 Other long term (current) drug therapy: Secondary | ICD-10-CM

## 2018-04-04 DIAGNOSIS — S60562A Insect bite (nonvenomous) of left hand, initial encounter: Secondary | ICD-10-CM | POA: Diagnosis present

## 2018-04-04 DIAGNOSIS — L98491 Non-pressure chronic ulcer of skin of other sites limited to breakdown of skin: Secondary | ICD-10-CM | POA: Diagnosis present

## 2018-04-04 NOTE — ED Triage Notes (Signed)
Patient ambulatory to triage with steady gait, without difficulty or distress noted; pt seen last week for left hand pain/swelling; rx antibiotics and steroids with only some relief but symptoms increased after completion of medicines;  Now with draining ulceration to base of 5th digit

## 2018-04-04 NOTE — ED Notes (Addendum)
Multiple scarring noted to right FA and antecub region; pt refuses blood draw on right arm, st "it's been hurting me"; pt points out area that he wants accessed for labs draw

## 2018-04-05 ENCOUNTER — Emergency Department: Payer: Medicare Other

## 2018-04-05 ENCOUNTER — Inpatient Hospital Stay
Admission: EM | Admit: 2018-04-05 | Discharge: 2018-04-07 | DRG: 580 | Disposition: A | Discharge status: Home Health | Payer: Medicare Other | Attending: Internal Medicine | Admitting: Internal Medicine

## 2018-04-05 ENCOUNTER — Other Ambulatory Visit: Payer: Self-pay

## 2018-04-05 ENCOUNTER — Encounter: Payer: Self-pay | Admitting: Internal Medicine

## 2018-04-05 DIAGNOSIS — L98491 Non-pressure chronic ulcer of skin of other sites limited to breakdown of skin: Secondary | ICD-10-CM | POA: Diagnosis present

## 2018-04-05 DIAGNOSIS — S60562A Insect bite (nonvenomous) of left hand, initial encounter: Secondary | ICD-10-CM | POA: Diagnosis present

## 2018-04-05 DIAGNOSIS — L02512 Cutaneous abscess of left hand: Secondary | ICD-10-CM | POA: Diagnosis present

## 2018-04-05 DIAGNOSIS — L03119 Cellulitis of unspecified part of limb: Secondary | ICD-10-CM

## 2018-04-05 DIAGNOSIS — Z91013 Allergy to seafood: Secondary | ICD-10-CM | POA: Diagnosis not present

## 2018-04-05 DIAGNOSIS — Z79899 Other long term (current) drug therapy: Secondary | ICD-10-CM | POA: Diagnosis not present

## 2018-04-05 DIAGNOSIS — W57XXXA Bitten or stung by nonvenomous insect and other nonvenomous arthropods, initial encounter: Secondary | ICD-10-CM | POA: Diagnosis present

## 2018-04-05 DIAGNOSIS — L02519 Cutaneous abscess of unspecified hand: Secondary | ICD-10-CM | POA: Diagnosis present

## 2018-04-05 DIAGNOSIS — M869 Osteomyelitis, unspecified: Secondary | ICD-10-CM

## 2018-04-05 DIAGNOSIS — L03114 Cellulitis of left upper limb: Secondary | ICD-10-CM | POA: Diagnosis present

## 2018-04-05 DIAGNOSIS — F17210 Nicotine dependence, cigarettes, uncomplicated: Secondary | ICD-10-CM | POA: Diagnosis present

## 2018-04-05 DIAGNOSIS — I10 Essential (primary) hypertension: Secondary | ICD-10-CM | POA: Diagnosis present

## 2018-04-05 DIAGNOSIS — Z66 Do not resuscitate: Secondary | ICD-10-CM | POA: Diagnosis present

## 2018-04-05 LAB — CBC WITH DIFFERENTIAL/PLATELET
BASOS ABS: 0.1 10*3/uL (ref 0–0.1)
BASOS PCT: 1 %
Eosinophils Absolute: 0.2 10*3/uL (ref 0–0.7)
Eosinophils Relative: 3 %
HCT: 39.6 % — ABNORMAL LOW (ref 40.0–52.0)
Hemoglobin: 13.8 g/dL (ref 13.0–18.0)
LYMPHS PCT: 26 %
Lymphs Abs: 2.5 10*3/uL (ref 1.0–3.6)
MCH: 29.1 pg (ref 26.0–34.0)
MCHC: 34.9 g/dL (ref 32.0–36.0)
MCV: 83.2 fL (ref 80.0–100.0)
MONO ABS: 0.9 10*3/uL (ref 0.2–1.0)
MONOS PCT: 9 %
Neutro Abs: 5.8 10*3/uL (ref 1.4–6.5)
Neutrophils Relative %: 61 %
PLATELETS: 295 10*3/uL (ref 150–440)
RBC: 4.76 MIL/uL (ref 4.40–5.90)
RDW: 14.2 % (ref 11.5–14.5)
WBC: 9.5 10*3/uL (ref 3.8–10.6)

## 2018-04-05 LAB — COMPREHENSIVE METABOLIC PANEL
ALT: 20 U/L (ref 17–63)
ANION GAP: 9 (ref 5–15)
AST: 23 U/L (ref 15–41)
Albumin: 3.6 g/dL (ref 3.5–5.0)
Alkaline Phosphatase: 51 U/L (ref 38–126)
BUN: 13 mg/dL (ref 6–20)
CALCIUM: 8.9 mg/dL (ref 8.9–10.3)
CHLORIDE: 103 mmol/L (ref 101–111)
CO2: 25 mmol/L (ref 22–32)
CREATININE: 0.69 mg/dL (ref 0.61–1.24)
Glucose, Bld: 98 mg/dL (ref 65–99)
POTASSIUM: 3.6 mmol/L (ref 3.5–5.1)
Sodium: 137 mmol/L (ref 135–145)
TOTAL PROTEIN: 6.6 g/dL (ref 6.5–8.1)
Total Bilirubin: 0.5 mg/dL (ref 0.3–1.2)

## 2018-04-05 LAB — LACTIC ACID, PLASMA: LACTIC ACID, VENOUS: 1 mmol/L (ref 0.5–1.9)

## 2018-04-05 MED ORDER — VANCOMYCIN HCL IN DEXTROSE 1-5 GM/200ML-% IV SOLN
1000.0000 mg | Freq: Once | INTRAVENOUS | Status: AC
  Administered 2018-04-05: 1000 mg via INTRAVENOUS
  Filled 2018-04-05: qty 200

## 2018-04-05 MED ORDER — OXYCODONE HCL 5 MG PO TABS
5.0000 mg | ORAL_TABLET | ORAL | Status: DC | PRN
  Administered 2018-04-05: 5 mg via ORAL
  Filled 2018-04-05: qty 1

## 2018-04-05 MED ORDER — OXYCODONE-ACETAMINOPHEN 5-325 MG PO TABS
1.0000 | ORAL_TABLET | Freq: Once | ORAL | Status: AC
  Administered 2018-04-05: 1 via ORAL
  Filled 2018-04-05: qty 1

## 2018-04-05 MED ORDER — OXYCODONE HCL 5 MG PO TABS
10.0000 mg | ORAL_TABLET | ORAL | Status: DC | PRN
  Administered 2018-04-05 – 2018-04-07 (×7): 10 mg via ORAL
  Filled 2018-04-05 (×7): qty 2

## 2018-04-05 MED ORDER — VANCOMYCIN HCL 10 G IV SOLR
1750.0000 mg | Freq: Two times a day (BID) | INTRAVENOUS | Status: DC
  Administered 2018-04-05 – 2018-04-06 (×2): 1750 mg via INTRAVENOUS
  Filled 2018-04-05 (×4): qty 1750

## 2018-04-05 MED ORDER — SODIUM CHLORIDE 0.9 % IV SOLN
1.0000 g | INTRAVENOUS | Status: DC
  Filled 2018-04-05: qty 10

## 2018-04-05 MED ORDER — SENNOSIDES-DOCUSATE SODIUM 8.6-50 MG PO TABS: 1.0000 | ORAL_TABLET | Freq: Every evening | ORAL | Status: DC | PRN

## 2018-04-05 MED ORDER — ONDANSETRON HCL 4 MG PO TABS: 4.0000 mg | ORAL_TABLET | Freq: Four times a day (QID) | ORAL | Status: DC | PRN

## 2018-04-05 MED ORDER — ACETAMINOPHEN 325 MG PO TABS: 650.0000 mg | ORAL_TABLET | Freq: Four times a day (QID) | ORAL | Status: DC | PRN

## 2018-04-05 MED ORDER — ONDANSETRON HCL 4 MG/2ML IJ SOLN: 4.0000 mg | Freq: Four times a day (QID) | INTRAMUSCULAR | Status: DC | PRN

## 2018-04-05 MED ORDER — CLINDAMYCIN PHOSPHATE 600 MG/50ML IV SOLN
600.0000 mg | Freq: Three times a day (TID) | INTRAVENOUS | Status: DC
  Administered 2018-04-05: 600 mg via INTRAVENOUS
  Filled 2018-04-05 (×3): qty 50

## 2018-04-05 MED ORDER — LISINOPRIL 20 MG PO TABS
40.0000 mg | ORAL_TABLET | Freq: Every day | ORAL | Status: DC
  Administered 2018-04-05 – 2018-04-07 (×3): 40 mg via ORAL
  Filled 2018-04-05 (×3): qty 2

## 2018-04-05 MED ORDER — CLINDAMYCIN PHOSPHATE 600 MG/50ML IV SOLN
600.0000 mg | Freq: Once | INTRAVENOUS | Status: AC
  Administered 2018-04-05: 600 mg via INTRAVENOUS
  Filled 2018-04-05: qty 50

## 2018-04-05 MED ORDER — SODIUM CHLORIDE 0.9 % IV SOLN
1.0000 g | Freq: Once | INTRAVENOUS | Status: AC
  Administered 2018-04-05: 1 g via INTRAVENOUS
  Filled 2018-04-05: qty 10

## 2018-04-05 MED ORDER — ENOXAPARIN SODIUM 40 MG/0.4ML ~~LOC~~ SOLN
40.0000 mg | SUBCUTANEOUS | Status: DC
  Administered 2018-04-05 – 2018-04-07 (×3): 40 mg via SUBCUTANEOUS
  Filled 2018-04-05 (×3): qty 0.4

## 2018-04-05 MED ORDER — BISACODYL 5 MG PO TBEC: 5.0000 mg | DELAYED_RELEASE_TABLET | Freq: Every day | ORAL | Status: DC | PRN

## 2018-04-05 MED ORDER — ACETAMINOPHEN 650 MG RE SUPP: 650.0000 mg | Freq: Four times a day (QID) | RECTAL | Status: DC | PRN

## 2018-04-05 NOTE — H&P (Signed)
[employer] [facility] [place] at [facility]   PATIENT NAME: Manuel Gregory    MR#:  [id number]  DATE OF BIRTH:  11/24/1960  DATE OF ADMISSION:  04/05/2018  PRIMARY CARE PHYSICIAN: Patient, No Pcp Per   REQUESTING/REFERRING PHYSICIAN: Irean Hong, MD  CHIEF COMPLAINT:   Chief Complaint  Patient presents with  . Cellulitis    HISTORY OF PRESENT ILLNESS:  Manuel Gregory  is a 57 y.o. male with a known history of HTN p/w L hand community-acquired purulent cellulitis w/ possible osteomyelitis. Yardwork 05/23, insect bite to dorsum of L hand at base of 4th/5th digits, erythema + swelling. Eval in ED, Rx Medrol DosePak. Symptoms improved until steroids ran out in the end of May. Endorsed increasing swelling ~2d later. Endorses purulent drainage for the past 2d. Endorses pain, decreased AROM + PROM of 4th + 5th digits. Denies fever, chills, diaphoresis, night sweats, rigors. CT imaging demonstrates bony erosion, concerning for osteomyelitis. SIRS (-).  PAST MEDICAL HISTORY:   Past Medical History:  Diagnosis Date  . Gout   . Hypertension     PAST SURGICAL HISTORY:   Past Surgical History:  Procedure Laterality Date  . FOOT SURGERY      SOCIAL HISTORY:   Social History   Tobacco Use  . Smoking status: Current Every Day Smoker    Packs/day: 0.50    Types: Cigarettes  . Smokeless tobacco: Never Used  Substance Use Topics  . Alcohol use: No    FAMILY HISTORY:  History reviewed. No pertinent family history.  DRUG ALLERGIES:   Allergies  Allergen Reactions  . Shellfish Allergy     REVIEW OF SYSTEMS:   Review of Systems  Constitutional: Negative for chills, diaphoresis, fever, malaise/fatigue and weight loss.  HENT: Negative for congestion, ear pain, hearing loss, nosebleeds, sinus pain, sore throat and tinnitus.   Eyes: Negative for blurred vision, double vision and photophobia.  Respiratory: Negative for cough, hemoptysis, sputum production, shortness of breath  and wheezing.   Cardiovascular: Negative for chest pain, palpitations, orthopnea, claudication, leg swelling and PND.  Gastrointestinal: Negative for abdominal pain, blood in stool, constipation, diarrhea, heartburn, melena, nausea and vomiting.  Genitourinary: Negative for dysuria, frequency, hematuria and urgency.  Musculoskeletal: Negative for back pain, myalgias and neck pain.  Skin: Negative for itching and rash.  Neurological: Negative for dizziness, tingling, tremors, sensory change, speech change, focal weakness, seizures, loss of consciousness, weakness and headaches.  Psychiatric/Behavioral: Negative for memory loss. The patient does not have insomnia.    MEDICATIONS AT HOME:   Prior to Admission medications   Medication Sig Start Date End Date Taking? Authorizing Provider  lisinopril (PRINIVIL,ZESTRIL) 20 MG tablet Take 2 tablets (40 mg total) by mouth daily. 03/22/18 05/21/18 Yes Menshew, Charlesetta Ivory, PA-C  predniSONE (STERAPRED UNI-PAK 21 TAB) 10 MG (21) TBPK tablet 6-day taper as directed. Patient not taking: Reported on 04/05/2018 03/22/18   Menshew, Charlesetta Ivory, PA-C      VITAL SIGNS:  Blood pressure (!) 176/111, pulse (!) 47, temperature 98.2 F (36.8 C), temperature source Oral, resp. rate (!) 22, height 6' (1.829 m), weight 88.5 kg (195 lb), SpO2 100 %.  PHYSICAL EXAMINATION:  Physical Exam  Constitutional: He is oriented to person, place, and time. He appears well-developed and well-nourished. He is active and cooperative.  Non-toxic appearance. He does not have a sickly appearance. He does not appear ill. No distress. He is not intubated.  HENT:  Head: Normocephalic and atraumatic.  Mouth/Throat: Oropharynx is clear and moist. No oropharyngeal exudate.  Eyes: Conjunctivae, EOM and lids are normal. No scleral icterus.  Neck: Neck supple. No JVD present. No thyromegaly present.  Cardiovascular: Normal rate, regular rhythm, S1 normal, S2 normal and normal heart  sounds.  No extrasystoles are present. Exam reveals no gallop, no S3, no S4, no distant heart sounds and no friction rub.  No murmur heard. Pulmonary/Chest: Effort normal and breath sounds normal. No accessory muscle usage or stridor. No apnea, no tachypnea and no bradypnea. He is not intubated. No respiratory distress. He has no decreased breath sounds. He has no wheezes. He has no rhonchi. He has no rales. He exhibits no tenderness.  Abdominal: Soft. Bowel sounds are normal. He exhibits no distension. There is no tenderness. There is no rebound and no guarding.  Musculoskeletal:       Left hand: He exhibits decreased range of motion, tenderness, deformity and swelling.  Lymphadenopathy:    He has no cervical adenopathy.  Neurological: He is alert and oriented to person, place, and time.  Skin: Skin is warm. Lesion noted. He is not diaphoretic. There is erythema.  Psychiatric: He has a normal mood and affect. His speech is normal and behavior is normal. Judgment and thought content normal. Cognition and memory are normal.   (+) erythema + swelling + induration + purulent drainage L hand dorsum at base of 4th + 5th digits, decreased AROM + PROM of 4th + 5th digits, pain on AROM + PROM. LABORATORY PANEL:   CBC Recent Labs  Lab 04/04/18 2330  WBC 9.5  HGB 13.8  HCT 39.6*  PLT 295   ------------------------------------------------------------------------------------------------------------------  Chemistries  Recent Labs  Lab 04/04/18 2330  NA 137  K 3.6  CL 103  CO2 25  GLUCOSE 98  BUN 13  CREATININE 0.69  CALCIUM 8.9  AST 23  ALT 20  ALKPHOS 51  BILITOT 0.5   ------------------------------------------------------------------------------------------------------------------  Cardiac Enzymes No results for input(s): TROPONINI in the last 168  hours. ------------------------------------------------------------------------------------------------------------------  RADIOLOGY:  Ct Hand Left Wo Contrast  Result Date: 04/05/2018 CLINICAL DATA:  Acute onset of left hand pain and swelling. EXAM: CT OF THE LEFT HAND WITHOUT CONTRAST TECHNIQUE: Multidetector CT imaging of the left hand was performed according to the standard protocol. Multiplanar CT image reconstructions were also generated. COMPARISON:  Left hand radiographs performed earlier today at 1:20 a.m. FINDINGS: Bones/Joint/Cartilage Apparent mild osseous erosion is noted at the radial aspect of the base of the fifth proximal phalanx, and also at multiple locations about the distal aspect of the fifth metacarpal, raising concern for osteomyelitis. As noted on radiograph, there is mild ulnar subluxation at the fifth metacarpophalangeal joint. An underlying large joint effusion is noted. The cartilage is not well assessed on CT. Ligaments Suboptimally assessed by CT. Muscles and Tendons There is diffusely decreased attenuation with regard to the musculature about the fifth metacarpal, and vague edema about the musculature of the hypothenar eminence, compatible with myositis and soft tissue infection. No definite tenosynovitis is characterized on CT. The carpal tunnel is grossly unremarkable in appearance. Soft tissues Diffuse soft tissue edema is seen tracking along the dorsum of the hand, most prominent about the fifth metacarpal and fifth metacarpophalangeal joint. The vasculature is not well assessed without contrast. IMPRESSION: 1. Apparent mild osseous erosion at the radial aspect of the base of the fifth proximal phalanx, and also at multiple locations about the distal aspect of the fifth metacarpal, raising concern for osteomyelitis.  As noted on radiograph, there is mild ulnar subluxation at the fifth metacarpophalangeal joint. Underlying large joint effusion noted. 2. Myositis and soft tissue  infection noted with regard to the musculature about the fifth metacarpal and musculature of the hypothenar eminence. 3. Diffuse soft tissue edema tracking along the dorsum of the hand, most prominent about the fifth metacarpal and fifth metacarpophalangeal joint. Electronically Signed   By: Roanna RaiderJeffery  Chang M.D.   On: 04/05/2018 04:23   Dg Hand Complete Left  Result Date: 04/05/2018 CLINICAL DATA:  Left hand pain and swelling.  Osteomyelitis. EXAM: LEFT HAND - COMPLETE 3+ VIEW COMPARISON:  None. FINDINGS: Soft tissue edema with small soft tissue defect about the ulnar aspect of the hand in the region of the metacarpal phalangeal joint. No tracking soft tissue air or radiopaque foreign body. Fifth digit held in slight flexion the proximal interphalangeal joint. Questionable fracture at the base of the fifth proximal phalanx at the metacarpal phalangeal joint. Bony destructive change, osseous erosions or abnormal density to suggest osteomyelitis. IMPRESSION: Possible fracture at the base of the fifth proximal phalanx at the metacarpal phalangeal joint. No bony destructive change to suggest osteomyelitis. Diffuse soft tissue edema with skin defect about the ulnar aspect of the hand. Electronically Signed   By: Rubye OaksMelanie  Ehinger M.D.   On: 04/05/2018 02:08   IMPRESSION AND PLAN:   A/P: 66M L hand community-acquired purulent cellulitis w/ possible osteomyelitis. -Admit to Med/Surg -Clinda + Ceftriaxone -May need I&D -Hand surgery + ID consults -Pain ctrl -c/w home meds -Cardiac diet -Lovenox -Full code -Admission, > 2 midnights   All the records are reviewed and case discussed with ED provider. Management plans discussed with the patient, family and they are in agreement.  CODE STATUS: Full code  TOTAL TIME TAKING CARE OF THIS PATIENT: 45 minutes.    Barbaraann RondoPrasanna Darron Stuck M.D on 04/05/2018 at 6:27 AM  Between 7am to 6pm - Pager - [phone]  After 6pm go to [email or web address] - Air traffic controllerpassword [email or web address]  [facility]  [facility] [place] [facility]  Office  [phone]  CC: Primary care physician; Patient, No Pcp Per   Note: This dictation was prepared with Dragon dictation along with smaller phrase technology. Any transcriptional errors that result from this process are unintentional.

## 2018-04-05 NOTE — Consult Note (Signed)
[facility]     Reason for Consult: Hand cellulitis   Referring Physician: Coy Saunas Date of Admission:  04/05/2018   Active Problems:   Cellulitis and abscess of hand   HPI: Manuel Gregory is a 57 y.o. male admitted with L hand pain and swelling following an insect bite during yard work on 5/23. He was seen in ED and treated with steroids. It progressed and now is swollen and painful. On admit wbc 9 and no fever.  Started clinda and ceftriaxone. CT scan shows myositis, cellulitiis and joint fluid. Reports hand feeling a little better, expressing lots of pus. A little less swollen   Past Medical History:  Diagnosis Date  . Gout   . Hypertension    Past Surgical History:  Procedure Laterality Date  . FOOT SURGERY     Social History   Tobacco Use  . Smoking status: Current Every Day Smoker    Packs/day: 0.50    Types: Cigarettes  . Smokeless tobacco: Never Used  Substance Use Topics  . Alcohol use: No  . Drug use: No   History reviewed. No pertinent family history.  Allergies:  Allergies  Allergen Reactions  . Shellfish Allergy     Current antibiotics: Antibiotics Given (last 72 hours)    Date/Time Action Medication Dose Rate   04/05/18 0142 New Bag/Given   cefTRIAXone (ROCEPHIN) 1 g in sodium chloride 0.9 % 100 mL IVPB 1 g 200 mL/hr   04/05/18 0201 New Bag/Given   clindamycin (CLEOCIN) IVPB 600 mg 600 mg 100 mL/hr   04/05/18 1046 New Bag/Given   clindamycin (CLEOCIN) IVPB 600 mg 600 mg 100 mL/hr      MEDICATIONS: . enoxaparin (LOVENOX) injection  40 mg Subcutaneous Q24H  . lisinopril  40 mg Oral Daily    Review of Systems - 11 systems reviewed and negative per HPI   OBJECTIVE: Temp:  [98.2 F (36.8 C)-98.7 F (37.1 C)] 98.7 F (37.1 C) (06/06 1230) Pulse Rate:  [47-88] 50 (06/06 1230) Resp:  [17-22] 18 (06/06 1230) BP: (148-182)/(80-111) 156/98 (06/06 1230) SpO2:  [98 %-100 %] 98 % (06/06 1230) Weight:  [80.5 kg (177 lb 7.5  oz)-88.5 kg (195 lb)] 80.5 kg (177 lb 7.5 oz) (06/06 9476) Physical Exam  Constitutional: He is oriented to person, place, and time. He appears well-developed and well-nourished. No distress.  HENT:  Mouth/Throat: Oropharynx is clear and moist. No oropharyngeal exudate.  Cardiovascular: Normal rate, regular rhythm and normal heart sounds. Exam reveals no gallop and no friction rub.  No murmur heard.  Pulmonary/Chest: Effort normal and breath sounds normal. No respiratory distress. He has no wheezes.  Abdominal: Soft. Bowel sounds are normal. He exhibits no distension. There is no tenderness.  Lymphadenopathy: He has no cervical adenopathy.  Ext L hand with swelling over dorsum of lateral hand with a 1 cm wound over MCP joint on 5th finger. Purulence draiaing from it. Limited ability to flex 5th finger.  Neurological: He is alert and oriented to person, place, and time.  Skin: wound and erythema as above Psychiatric: He has a normal mood and affect. His behavior is normal.     LABS: Results for orders placed or performed during the hospital encounter of 04/05/18 (from the past 48 hour(s))  CBC with Differential     Status: Abnormal   Collection Time: 04/04/18 11:30 PM  Result Value Ref Range   WBC 9.5 3.8 - 10.6 K/uL   RBC 4.76 4.40 - 5.90  MIL/uL   Hemoglobin 13.8 13.0 - 18.0 g/dL   HCT 39.6 (L) 40.0 - 52.0 %   MCV 83.2 80.0 - 100.0 fL   MCH 29.1 26.0 - 34.0 pg   MCHC 34.9 32.0 - 36.0 g/dL   RDW 14.2 11.5 - 14.5 %   Platelets 295 150 - 440 K/uL   Neutrophils Relative % 61 %   Neutro Abs 5.8 1.4 - 6.5 K/uL   Lymphocytes Relative 26 %   Lymphs Abs 2.5 1.0 - 3.6 K/uL   Monocytes Relative 9 %   Monocytes Absolute 0.9 0.2 - 1.0 K/uL   Eosinophils Relative 3 %   Eosinophils Absolute 0.2 0 - 0.7 K/uL   Basophils Relative 1 %   Basophils Absolute 0.1 0 - 0.1 K/uL    Comment: Performed at [facility], [address] [place], [place] [address]  Comprehensive metabolic panel      Status: None   Collection Time: 04/04/18 11:30 PM  Result Value Ref Range   Sodium 137 135 - 145 mmol/L   Potassium 3.6 3.5 - 5.1 mmol/L   Chloride 103 101 - 111 mmol/L   CO2 25 22 - 32 mmol/L   Glucose, Bld 98 65 - 99 mg/dL   BUN 13 6 - 20 mg/dL   Creatinine, Ser 0.69 0.61 - 1.24 mg/dL   Calcium 8.9 8.9 - 10.3 mg/dL   Total Protein 6.6 6.5 - 8.1 g/dL   Albumin 3.6 3.5 - 5.0 g/dL   AST 23 15 - 41 U/L   ALT 20 17 - 63 U/L   Alkaline Phosphatase 51 38 - 126 U/L   Total Bilirubin 0.5 0.3 - 1.2 mg/dL   GFR calc non Af Amer >60 >60 mL/min   GFR calc Af Amer >60 >60 mL/min    Comment: (NOTE) The eGFR has been calculated using the CKD EPI equation. This calculation has not been validated in all clinical situations. eGFR's persistently <60 mL/min signify possible Chronic Kidney Disease.    Anion gap 9 5 - 15    Comment: Performed at [facility], [address] [place], [place] [address]  Blood culture (routine x 2)     Status: None (Preliminary result)   Collection Time: 04/04/18 11:30 PM  Result Value Ref Range   Specimen Description BLOOD LEFT FA    Special Requests      BOTTLES DRAWN AEROBIC AND ANAEROBIC Blood Culture results may not be optimal due to an excessive volume of blood received in culture bottles   Culture      NO GROWTH < 12 HOURS Performed at [facility], [address] [place], [place] [address]    Report Status PENDING   Lactic acid, plasma     Status: None   Collection Time: 04/04/18 11:30 PM  Result Value Ref Range   Lactic Acid, Venous 1.0 0.5 - 1.9 mmol/L    Comment: Performed at [facility], [address] [place], [place] [address]  Blood culture (routine x 2)     Status: None (Preliminary result)   Collection Time: 04/05/18  1:41 AM  Result Value Ref Range   Specimen Description BLOOD RIGHT AC    Special Requests      BOTTLES DRAWN AEROBIC AND ANAEROBIC Blood Culture adequate volume   Culture      NO GROWTH <  12 HOURS Performed at [facility], [address] [place], [place] [address]    Report Status PENDING  No components found for: ESR, C REACTIVE PROTEIN MICRO: Recent Results (from the past 720 hour(s))  Blood culture (routine x 2)     Status: None (Preliminary result)   Collection Time: 04/04/18 11:30 PM  Result Value Ref Range Status   Specimen Description BLOOD LEFT FA  Final   Special Requests   Final    BOTTLES DRAWN AEROBIC AND ANAEROBIC Blood Culture results may not be optimal due to an excessive volume of blood received in culture bottles   Culture   Final    NO GROWTH < 12 HOURS Performed at [facility], [address], [place], [place] [address]    Report Status PENDING  Incomplete  Blood culture (routine x 2)     Status: None (Preliminary result)   Collection Time: 04/05/18  1:41 AM  Result Value Ref Range Status   Specimen Description BLOOD RIGHT AC  Final   Special Requests   Final    BOTTLES DRAWN AEROBIC AND ANAEROBIC Blood Culture adequate volume   Culture   Final    NO GROWTH < 12 HOURS Performed at [facility], [address] [place], [place] [address]    Report Status PENDING  Incomplete    IMAGING: Ct Hand Left Wo Contrast  Result Date: 04/05/2018 CLINICAL DATA:  Acute onset of left hand pain and swelling. EXAM: CT OF THE LEFT HAND WITHOUT CONTRAST TECHNIQUE: Multidetector CT imaging of the left hand was performed according to the standard protocol. Multiplanar CT image reconstructions were also generated. COMPARISON:  Left hand radiographs performed earlier today at 1:20 a.m. FINDINGS: Bones/Joint/Cartilage Apparent mild osseous erosion is noted at the radial aspect of the base of the fifth proximal phalanx, and also at multiple locations about the distal aspect of the fifth metacarpal, raising concern for osteomyelitis. As noted on radiograph, there is mild ulnar subluxation at the fifth metacarpophalangeal joint. An  underlying large joint effusion is noted. The cartilage is not well assessed on CT. Ligaments Suboptimally assessed by CT. Muscles and Tendons There is diffusely decreased attenuation with regard to the musculature about the fifth metacarpal, and vague edema about the musculature of the hypothenar eminence, compatible with myositis and soft tissue infection. No definite tenosynovitis is characterized on CT. The carpal tunnel is grossly unremarkable in appearance. Soft tissues Diffuse soft tissue edema is seen tracking along the dorsum of the hand, most prominent about the fifth metacarpal and fifth metacarpophalangeal joint. The vasculature is not well assessed without contrast. IMPRESSION: 1. Apparent mild osseous erosion at the radial aspect of the base of the fifth proximal phalanx, and also at multiple locations about the distal aspect of the fifth metacarpal, raising concern for osteomyelitis. As noted on radiograph, there is mild ulnar subluxation at the fifth metacarpophalangeal joint. Underlying large joint effusion noted. 2. Myositis and soft tissue infection noted with regard to the musculature about the fifth metacarpal and musculature of the hypothenar eminence. 3. Diffuse soft tissue edema tracking along the dorsum of the hand, most prominent about the fifth metacarpal and fifth metacarpophalangeal joint. Electronically Signed   By: Garald Balding M.D.   On: 04/05/2018 04:23   Dg Hand Complete Left  Result Date: 04/05/2018 CLINICAL DATA:  Left hand pain and swelling.  Osteomyelitis. EXAM: LEFT HAND - COMPLETE 3+ VIEW COMPARISON:  None. FINDINGS: Soft tissue edema with small soft tissue defect about the ulnar aspect of the hand in the region of the metacarpal phalangeal joint. No tracking soft tissue air or  radiopaque foreign body. Fifth digit held in slight flexion the proximal interphalangeal joint. Questionable fracture at the base of the fifth proximal phalanx at the metacarpal phalangeal joint.  Bony destructive change, osseous erosions or abnormal density to suggest osteomyelitis. IMPRESSION: Possible fracture at the base of the fifth proximal phalanx at the metacarpal phalangeal joint. No bony destructive change to suggest osteomyelitis. Diffuse soft tissue edema with skin defect about the ulnar aspect of the hand. Electronically Signed   By: Jeb Levering M.D.   On: 04/05/2018 02:08    Assessment:   Manuel Gregory is a 57 y.o. male with L hand infection following insect bite or a boil about 10-14 days ago. Now has marked swelling over 4th and 5th knuckles, mod drainage and limited ROM. CT shows possible joint infection and myositis.  This is likely staph aureus infection. I have cultured the purulence. Will need ortho eval and likely surgery.  Recommendations Change clinda to vancomycin Continue clindamycin Surgical opinion. Thank you very much for allowing me to participate in the care of this patient. Please call with questions.   Cheral Marker. Ola Spurr, MD

## 2018-04-05 NOTE — Progress Notes (Signed)
[facility] [place] [facility]   PATIENT NAME: Manuel Gregory    MR#:  [id number]  DATE OF BIRTH:  September 10, 1961  SUBJECTIVE:  CHIEF COMPLAINT:  Pt has left hand pain and swelling  REVIEW OF SYSTEMS:  CONSTITUTIONAL: No fever, fatigue or weakness.  EYES: No blurred or double vision.  EARS, NOSE, AND THROAT: No tinnitus or ear pain.  RESPIRATORY: No cough, shortness of breath, wheezing or hemoptysis.  CARDIOVASCULAR: No chest pain, orthopnea, edema.  GASTROINTESTINAL: No nausea, vomiting, diarrhea or abdominal pain.  GENITOURINARY: No dysuria, hematuria.  ENDOCRINE: No polyuria, nocturia,  HEMATOLOGY: No anemia, easy bruising or bleeding SKIN: No rash or lesion. MUSCULOSKELETAL: Left hand pain and swelling no joint pain or arthritis.   NEUROLOGIC: No tingling, numbness, weakness.  PSYCHIATRY: No anxiety or depression.   DRUG ALLERGIES:   Allergies  Allergen Reactions  . Shellfish Allergy     VITALS:  Blood pressure (!) 156/98, pulse (!) 50, temperature 98.7 F (37.1 C), temperature source Oral, resp. rate 18, height 6' (1.829 m), weight 80.5 kg (177 lb 7.5 oz), SpO2 98 %.  PHYSICAL EXAMINATION:  GENERAL:  57 y.o.-year-old patient lying in the bed with no acute distress.  EYES: Pupils equal, round, reactive to light and accommodation. No scleral icterus. Extraocular muscles intact.  HEENT: Head atraumatic, normocephalic. Oropharynx and nasopharynx clear.  NECK:  Supple, no jugular venous distention. No thyroid enlargement, no tenderness.  LUNGS: Normal breath sounds bilaterally, no wheezing, rales,rhonchi or crepitation. No use of accessory muscles of respiration.  CARDIOVASCULAR: S1, S2 normal. No murmurs, rubs, or gallops.  ABDOMEN: Soft, nontender, nondistended. Bowel sounds present. No organomegaly or mass.  EXTREMITIES: Left hand is erythematous, swollen and tender  no pedal edema, cyanosis, or clubbing.  NEUROLOGIC: Cranial nerves II through XII  are intact. Muscle strength 5/5 in all extremities. Sensation intact. Gait not checked.  PSYCHIATRIC: The patient is alert and oriented x 3.  SKIN: No obvious rash, lesion, or ulcer.    LABORATORY PANEL:   CBC Recent Labs  Lab 04/04/18 2330  WBC 9.5  HGB 13.8  HCT 39.6*  PLT 295   ------------------------------------------------------------------------------------------------------------------  Chemistries  Recent Labs  Lab 04/04/18 2330  NA 137  K 3.6  CL 103  CO2 25  GLUCOSE 98  BUN 13  CREATININE 0.69  CALCIUM 8.9  AST 23  ALT 20  ALKPHOS 51  BILITOT 0.5   ------------------------------------------------------------------------------------------------------------------  Cardiac Enzymes No results for input(s): TROPONINI in the last 168 hours. ------------------------------------------------------------------------------------------------------------------  RADIOLOGY:  Ct Hand Left Wo Contrast  Result Date: 04/05/2018 CLINICAL DATA:  Acute onset of left hand pain and swelling. EXAM: CT OF THE LEFT HAND WITHOUT CONTRAST TECHNIQUE: Multidetector CT imaging of the left hand was performed according to the standard protocol. Multiplanar CT image reconstructions were also generated. COMPARISON:  Left hand radiographs performed earlier today at 1:20 a.m. FINDINGS: Bones/Joint/Cartilage Apparent mild osseous erosion is noted at the radial aspect of the base of the fifth proximal phalanx, and also at multiple locations about the distal aspect of the fifth metacarpal, raising concern for osteomyelitis. As noted on radiograph, there is mild ulnar subluxation at the fifth metacarpophalangeal joint. An underlying large joint effusion is noted. The cartilage is not well assessed on CT. Ligaments Suboptimally assessed by CT. Muscles and Tendons There is diffusely decreased attenuation with regard to the musculature about the fifth metacarpal, and vague edema about the musculature of the  hypothenar eminence, compatible with myositis  and soft tissue infection. No definite tenosynovitis is characterized on CT. The carpal tunnel is grossly unremarkable in appearance. Soft tissues Diffuse soft tissue edema is seen tracking along the dorsum of the hand, most prominent about the fifth metacarpal and fifth metacarpophalangeal joint. The vasculature is not well assessed without contrast. IMPRESSION: 1. Apparent mild osseous erosion at the radial aspect of the base of the fifth proximal phalanx, and also at multiple locations about the distal aspect of the fifth metacarpal, raising concern for osteomyelitis. As noted on radiograph, there is mild ulnar subluxation at the fifth metacarpophalangeal joint. Underlying large joint effusion noted. 2. Myositis and soft tissue infection noted with regard to the musculature about the fifth metacarpal and musculature of the hypothenar eminence. 3. Diffuse soft tissue edema tracking along the dorsum of the hand, most prominent about the fifth metacarpal and fifth metacarpophalangeal joint. Electronically Signed   By: Roanna RaiderJeffery  Chang M.D.   On: 04/05/2018 04:23   Dg Hand Complete Left  Result Date: 04/05/2018 CLINICAL DATA:  Left hand pain and swelling.  Osteomyelitis. EXAM: LEFT HAND - COMPLETE 3+ VIEW COMPARISON:  None. FINDINGS: Soft tissue edema with small soft tissue defect about the ulnar aspect of the hand in the region of the metacarpal phalangeal joint. No tracking soft tissue air or radiopaque foreign body. Fifth digit held in slight flexion the proximal interphalangeal joint. Questionable fracture at the base of the fifth proximal phalanx at the metacarpal phalangeal joint. Bony destructive change, osseous erosions or abnormal density to suggest osteomyelitis. IMPRESSION: Possible fracture at the base of the fifth proximal phalanx at the metacarpal phalangeal joint. No bony destructive change to suggest osteomyelitis. Diffuse soft tissue edema with skin  defect about the ulnar aspect of the hand. Electronically Signed   By: Rubye OaksMelanie  Ehinger M.D.   On: 04/05/2018 02:08    EKG:   Orders placed or performed during the hospital encounter of 07/21/16  . EKG 12-Lead  . EKG 12-Lead  . ED EKG  . ED EKG  . EKG    ASSESSMENT AND PLAN:     76M L hand community-acquired purulent cellulitis w/ possible osteomyelitis.  #Left hand cellulitis following an insect bite 10 to 14 days ago CT scan has revealed possible joint infection and myositis -IV antibiotics clindamycin and vancomycin Seen by infectious disease, appreciate Dr. Sampson GoonFitzgerald recommendations Awaiting hand surgery consult-discussed with Dr. Odis LusterBowers he will come and see the patient -Pain ctrl as needed -c/w home meds -Cardiac diet  #Essential hypertension-continue lisinopril  #Gout-no flareups not on any home medications  -Lovenox -Full code -Admission, > 2 midnights        All the records are reviewed and case discussed with Care Management/Social Workerr. Management plans discussed with the patient, family and they are in agreement.  CODE STATUS: fc  TOTAL TIME TAKING CARE OF THIS PATIENT:  33 minutes.   POSSIBLE D/C IN 1-2  DAYS, DEPENDING ON CLINICAL CONDITION.  Note: This dictation was prepared with Dragon dictation along with smaller phrase technology. Any transcriptional errors that result from this process are unintentional.   Ramonita LabAruna Dakota Vanwart M.D on 04/05/2018 at 4:25 PM  Between 7am to 6pm - Pager - [phone] After 6pm go to [email or web address] - password [email or web address]  [facility]  Office  [phone]  CC: Primary care physician; Patient, No Pcp Per

## 2018-04-05 NOTE — ED Provider Notes (Signed)
[facility] Emergency Department Provider Note   ____________________________________________   First MD Initiated Contact with Patient 04/05/18 0119     (approximate)  I have reviewed the triage vital signs and the nursing notes.   HISTORY  Chief Complaint Cellulitis    HPI Manuel Gregory is a 57 y.o. male who returns to the ED from home with a chief complaint of left hand pain and swelling.  Patient was seen in the ED on 03/22/2018 for left hand swelling presumably secondary to insect bite or staying as patient works outdoors.  He was placed on a Medrol Dosepak for presumed localized allergic reaction.  There is no evidence of cellulitis at this time so no antibiotics were prescribed.  His lisinopril was refilled for hypertension.  Patient reports improvement of symptoms on Medrol Dosepak.  2 days after he completed the course of steroids patient states the area over his left pinky began to ulcerate.  Denies numbness or tingling.  Denies associated fever, chills, chest pain, shortness of breath, abdominal pain, nausea or vomiting.  Denies recent travel or trauma.   Past Medical History:  Diagnosis Date  . Gout   . Hypertension     There are no active problems to display for this patient.   Past Surgical History:  Procedure Laterality Date  . FOOT SURGERY      Prior to Admission medications   Medication Sig Start Date End Date Taking? Authorizing Provider  lisinopril (PRINIVIL,ZESTRIL) 20 MG tablet Take 2 tablets (40 mg total) by mouth daily. 03/22/18 05/21/18  Menshew, Charlesetta IvoryJenise V Bacon, PA-C  predniSONE (STERAPRED UNI-PAK 21 TAB) 10 MG (21) TBPK tablet 6-day taper as directed. 03/22/18   Menshew, Charlesetta IvoryJenise V Bacon, PA-C    Allergies Shellfish allergy  No family history on file.  Social History Social History   Tobacco Use  . Smoking status: Current Every Day Smoker    Packs/day: 0.50    Types: Cigarettes  . Smokeless tobacco: Never Used    Substance Use Topics  . Alcohol use: No  . Drug use: No    Review of Systems  Constitutional: No fever/chills Eyes: No visual changes. ENT: No sore throat. Cardiovascular: Denies chest pain. Respiratory: Denies shortness of breath. Gastrointestinal: No abdominal pain.  No nausea, no vomiting.  No diarrhea.  No constipation. Genitourinary: Negative for dysuria. Musculoskeletal: Positive for pain and swelling to left hand.  Negative for back pain. Skin: Negative for rash. Neurological: Negative for headaches, focal weakness or numbness.   ____________________________________________   PHYSICAL EXAM:  VITAL SIGNS: ED Triage Vitals  Enc Vitals Group     BP 04/04/18 2327 (!) 182/100     Pulse Rate 04/04/18 2327 61     Resp 04/04/18 2327 18     Temp 04/04/18 2327 98.4 F (36.9 C)     Temp Source 04/04/18 2327 Oral     SpO2 04/04/18 2327 98 %     Weight 04/04/18 2328 195 lb (88.5 kg)     Height 04/04/18 2328 6' (1.829 m)     Head Circumference --      Peak Flow --      Pain Score 04/04/18 2328 8     Pain Loc --      Pain Edu? --      Excl. in GC? --     Constitutional: Alert and oriented. Well appearing and in no acute distress. Eyes: Conjunctivae are normal. PERRL. EOMI. Head: Atraumatic. Nose: No congestion/rhinnorhea. Mouth/Throat:  Mucous membranes are moist.  Oropharynx non-erythematous. Neck: No stridor.   Cardiovascular: Normal rate, regular rhythm. Grossly normal heart sounds.  Good peripheral circulation. Respiratory: Normal respiratory effort.  No retractions. Lungs CTAB. Gastrointestinal: Soft and nontender. No distention. No abdominal bruits. No CVA tenderness. Musculoskeletal:  Left hand: Small ulcerated area over dorsal left proximal metacarpal with minimal surrounding area of warmth and erythema.  There is no red streaking towards the wrist or forearm.  5/5 motor strength and sensation.  2+ radial pulses.  Brisk, less than 5-second capillary  refill. Neurologic:  Normal speech and language. No gross focal neurologic deficits are appreciated. No gait instability. Skin:  Skin is warm, dry and intact. No rash noted. Psychiatric: Mood and affect are normal. Speech and behavior are normal.  ____________________________________________   LABS (all labs ordered are listed, but only abnormal results are displayed)  Labs Reviewed  CBC WITH DIFFERENTIAL/PLATELET - Abnormal; Notable for the following components:      Result Value   HCT 39.6 (*)    All other components within normal limits  CULTURE, BLOOD (ROUTINE X 2)  CULTURE, BLOOD (ROUTINE X 2)  COMPREHENSIVE METABOLIC PANEL  LACTIC ACID, PLASMA   ____________________________________________  EKG  None ____________________________________________  RADIOLOGY  ED MD interpretation: X-ray concerning for fifth metacarpal fracture; CT concerning for osteomyelitis  Official radiology report(s): Ct Hand Left Wo Contrast  Result Date: 04/05/2018 CLINICAL DATA:  Acute onset of left hand pain and swelling. EXAM: CT OF THE LEFT HAND WITHOUT CONTRAST TECHNIQUE: Multidetector CT imaging of the left hand was performed according to the standard protocol. Multiplanar CT image reconstructions were also generated. COMPARISON:  Left hand radiographs performed earlier today at 1:20 a.m. FINDINGS: Bones/Joint/Cartilage Apparent mild osseous erosion is noted at the radial aspect of the base of the fifth proximal phalanx, and also at multiple locations about the distal aspect of the fifth metacarpal, raising concern for osteomyelitis. As noted on radiograph, there is mild ulnar subluxation at the fifth metacarpophalangeal joint. An underlying large joint effusion is noted. The cartilage is not well assessed on CT. Ligaments Suboptimally assessed by CT. Muscles and Tendons There is diffusely decreased attenuation with regard to the musculature about the fifth metacarpal, and vague edema about the  musculature of the hypothenar eminence, compatible with myositis and soft tissue infection. No definite tenosynovitis is characterized on CT. The carpal tunnel is grossly unremarkable in appearance. Soft tissues Diffuse soft tissue edema is seen tracking along the dorsum of the hand, most prominent about the fifth metacarpal and fifth metacarpophalangeal joint. The vasculature is not well assessed without contrast. IMPRESSION: 1. Apparent mild osseous erosion at the radial aspect of the base of the fifth proximal phalanx, and also at multiple locations about the distal aspect of the fifth metacarpal, raising concern for osteomyelitis. As noted on radiograph, there is mild ulnar subluxation at the fifth metacarpophalangeal joint. Underlying large joint effusion noted. 2. Myositis and soft tissue infection noted with regard to the musculature about the fifth metacarpal and musculature of the hypothenar eminence. 3. Diffuse soft tissue edema tracking along the dorsum of the hand, most prominent about the fifth metacarpal and fifth metacarpophalangeal joint. Electronically Signed   By: Roanna Raider M.D.   On: 04/05/2018 04:23   Dg Hand Complete Left  Result Date: 04/05/2018 CLINICAL DATA:  Left hand pain and swelling.  Osteomyelitis. EXAM: LEFT HAND - COMPLETE 3+ VIEW COMPARISON:  None. FINDINGS: Soft tissue edema with small soft tissue defect about the  ulnar aspect of the hand in the region of the metacarpal phalangeal joint. No tracking soft tissue air or radiopaque foreign body. Fifth digit held in slight flexion the proximal interphalangeal joint. Questionable fracture at the base of the fifth proximal phalanx at the metacarpal phalangeal joint. Bony destructive change, osseous erosions or abnormal density to suggest osteomyelitis. IMPRESSION: Possible fracture at the base of the fifth proximal phalanx at the metacarpal phalangeal joint. No bony destructive change to suggest osteomyelitis. Diffuse soft tissue  edema with skin defect about the ulnar aspect of the hand. Electronically Signed   By: Rubye Oaks M.D.   On: 04/05/2018 02:08    ____________________________________________   PROCEDURES  Procedure(s) performed: None  Procedures  Critical Care performed:   CRITICAL CARE Performed by: Irean Hong   Total critical care time: 30 minutes  Critical care time was exclusive of separately billable procedures and treating other patients.  Critical care was necessary to treat or prevent imminent or life-threatening deterioration.  Critical care was time spent personally by me on the following activities: development of treatment plan with patient and/or surrogate as well as nursing, discussions with consultants, evaluation of patient's response to treatment, examination of patient, obtaining history from patient or surrogate, ordering and performing treatments and interventions, ordering and review of laboratory studies, ordering and review of radiographic studies, pulse oximetry and re-evaluation of patient's condition.  ____________________________________________   INITIAL IMPRESSION / ASSESSMENT AND PLAN / ED COURSE  As part of my medical decision making, I reviewed the following data within the electronic MEDICAL RECORD NUMBER Nursing notes reviewed and incorporated, Labs reviewed, Old chart reviewed and Notes from prior ED visits   57 year old male, nondiabetic, who presents with cellulitis and ulcerated lesion to his left dorsal hand.  Differential diagnosis includes but is not limited to cellulitis, osteomyelitis, infected insect bite, etc.  Patient is afebrile with normal WBC in spite of recent steroid use, and unremarkable lactic acid.  Will obtain x-ray to evaluate for osteomyelitis.  Nursing to clean wound.  Will administer Percocet for pain, 1 g IV Rocephin for standard cellulitis, 600 mg IV clindamycin for probable MRSA and reassess.   Clinical Course as of Apr 05 445   Thu Apr 05, 2018  0227 Questionable metacarpal fracture seen on x-ray.  Patient denies trauma.  Will obtain CT scan for clarification.   [JS]  [id number] Updated patient of CT results.  Discussed with hospitalist who will evaluate patient in the emergency department for admission.   [JS]    Clinical Course User Index [JS] Irean Hong, MD     ____________________________________________   FINAL CLINICAL IMPRESSION(S) / ED DIAGNOSES  Final diagnoses:  Skin ulcer of hand, limited to breakdown of skin (HCC)  Cellulitis of left upper extremity  Osteomyelitis of left hand, unspecified type [employer])     ED Discharge Orders    None       Note:  This document was prepared using Dragon voice recognition software and may include unintentional dictation errors.    Irean Hong, MD 04/05/18 [phone]

## 2018-04-05 NOTE — ED Notes (Signed)
Patient transported to CT 

## 2018-04-05 NOTE — Consult Note (Signed)
ORTHOPAEDIC CONSULTATION  REQUESTING PHYSICIAN: Ramonita LabGouru, Aruna, MD  Chief Complaint: left hand pain  HPI: Manuel Gregory is a 57 y.o. male who complains of left hand pain and swelling for the past two weeks. Please see ED notes and H&P for details. He denies any numbness or tingling or constitutional symptoms.  Past Medical History:  Diagnosis Date  . Gout   . Hypertension    Past Surgical History:  Procedure Laterality Date  . FOOT SURGERY     Social History   Socioeconomic History  . Marital status: Single    Spouse name: Not on file  . Number of children: Not on file  . Years of education: Not on file  . Highest education level: Not on file  Occupational History  . Not on file  Social Needs  . Financial resource strain: Not on file  . Food insecurity:    Worry: Not on file    Inability: Not on file  . Transportation needs:    Medical: Not on file    Non-medical: Not on file  Tobacco Use  . Smoking status: Current Every Day Smoker    Packs/day: 0.50    Types: Cigarettes  . Smokeless tobacco: Never Used  Substance and Sexual Activity  . Alcohol use: No  . Drug use: No  . Sexual activity: Not on file  Lifestyle  . Physical activity:    Days per week: Not on file    Minutes per session: Not on file  . Stress: Not on file  Relationships  . Social connections:    Talks on phone: Not on file    Gets together: Not on file    Attends religious service: Not on file    Active member of club or organization: Not on file    Attends meetings of clubs or organizations: Not on file    Relationship status: Not on file  Other Topics Concern  . Not on file  Social History Narrative  . Not on file   History reviewed. No pertinent family history. Allergies  Allergen Reactions  . Shellfish Allergy    Prior to Admission medications   Medication Sig Start Date End Date Taking? Authorizing Provider  lisinopril (PRINIVIL,ZESTRIL) 20 MG tablet Take 2 tablets (40 mg total)  by mouth daily. 03/22/18 05/21/18 Yes Menshew, Charlesetta IvoryJenise V Bacon, PA-C  predniSONE (STERAPRED UNI-PAK 21 TAB) 10 MG (21) TBPK tablet 6-day taper as directed. Patient not taking: Reported on 04/05/2018 03/22/18   Menshew, Charlesetta IvoryJenise V Bacon, PA-C   Ct Hand Left Wo Contrast  Result Date: 04/05/2018 CLINICAL DATA:  Acute onset of left hand pain and swelling. EXAM: CT OF THE LEFT HAND WITHOUT CONTRAST TECHNIQUE: Multidetector CT imaging of the left hand was performed according to the standard protocol. Multiplanar CT image reconstructions were also generated. COMPARISON:  Left hand radiographs performed earlier today at 1:20 a.m. FINDINGS: Bones/Joint/Cartilage Apparent mild osseous erosion is noted at the radial aspect of the base of the fifth proximal phalanx, and also at multiple locations about the distal aspect of the fifth metacarpal, raising concern for osteomyelitis. As noted on radiograph, there is mild ulnar subluxation at the fifth metacarpophalangeal joint. An underlying large joint effusion is noted. The cartilage is not well assessed on CT. Ligaments Suboptimally assessed by CT. Muscles and Tendons There is diffusely decreased attenuation with regard to the musculature about the fifth metacarpal, and vague edema about the musculature of the hypothenar eminence, compatible with myositis and soft tissue infection.  No definite tenosynovitis is characterized on CT. The carpal tunnel is grossly unremarkable in appearance. Soft tissues Diffuse soft tissue edema is seen tracking along the dorsum of the hand, most prominent about the fifth metacarpal and fifth metacarpophalangeal joint. The vasculature is not well assessed without contrast. IMPRESSION: 1. Apparent mild osseous erosion at the radial aspect of the base of the fifth proximal phalanx, and also at multiple locations about the distal aspect of the fifth metacarpal, raising concern for osteomyelitis. As noted on radiograph, there is mild ulnar subluxation at  the fifth metacarpophalangeal joint. Underlying large joint effusion noted. 2. Myositis and soft tissue infection noted with regard to the musculature about the fifth metacarpal and musculature of the hypothenar eminence. 3. Diffuse soft tissue edema tracking along the dorsum of the hand, most prominent about the fifth metacarpal and fifth metacarpophalangeal joint. Electronically Signed   By: Roanna Raider M.D.   On: 04/05/2018 04:23   Dg Hand Complete Left  Result Date: 04/05/2018 CLINICAL DATA:  Left hand pain and swelling.  Osteomyelitis. EXAM: LEFT HAND - COMPLETE 3+ VIEW COMPARISON:  None. FINDINGS: Soft tissue edema with small soft tissue defect about the ulnar aspect of the hand in the region of the metacarpal phalangeal joint. No tracking soft tissue air or radiopaque foreign body. Fifth digit held in slight flexion the proximal interphalangeal joint. Questionable fracture at the base of the fifth proximal phalanx at the metacarpal phalangeal joint. Bony destructive change, osseous erosions or abnormal density to suggest osteomyelitis. IMPRESSION: Possible fracture at the base of the fifth proximal phalanx at the metacarpal phalangeal joint. No bony destructive change to suggest osteomyelitis. Diffuse soft tissue edema with skin defect about the ulnar aspect of the hand. Electronically Signed   By: Rubye Oaks M.D.   On: 04/05/2018 02:08    Positive ROS: All other systems have been reviewed and were otherwise negative with the exception of those mentioned in the HPI and as above.  Physical Exam: General: Alert, no acute distress Cardiovascular: No pedal edema Respiratory: No cyanosis, no use of accessory musculature GI: No organomegaly, abdomen is soft and non-tender Skin: No lesions in the area of chief complaint Neurologic: Sensation intact distally Psychiatric: Patient is competent for consent with normal mood and affect Lymphatic: No axillary or cervical  lymphadenopathy  MUSCULOSKELETAL: left hand with 4 mm ulceration over dorsum of 5th MP joint, erythema, and swelling extend from dorsal to volar aspect, small amount of purulent drainage, no pain with flexion or extension of digits  Assessment: Hand infection  Plan: The infections does appear to be responding to antibiotics. If not significantly improved by tomorrow afternoon, I would plan an irrigation and debridement of the dorsal/ulnar aspect of the hand. He will be NPO after breakfast tomorrow and I will evaluate him in the afternoon for possible operative intervention. All of his questions are answered.    Manuel Herrlich, MD    04/05/2018 5:51 PM

## 2018-04-05 NOTE — Progress Notes (Signed)
Pharmacy Antibiotic Note  Manuel Gregory is a 57 y.o. male admitted on 04/05/2018 with Osteomyelitis. Admitted with L hand pain and swelling following an insect bite on 5/23. Was seen previously in the ED and started on steroid taper. Worsened and now admitted and started initially on clindamycin and ceftriaxone.   Pharmacy has been consulted for vancomycin dosing.  Plan: Continue ceftriaxone 1g IV daily   Vancomycin 1g IV x once followed by Vancomycin 1750mg  IV q12h with 6 hour stack dose. Estimated Cmin 17. Will check vanc trough before 4th dose. Will adjust dose as needed per consult.   Ke 0.099, t1/2 7, Vd 54.32  Height: 6' (182.9 cm) Weight: 177 lb 7.5 oz (80.5 kg) IBW/kg (Calculated) : 77.6  Temp (24hrs), Avg:98.4 F (36.9 C), Min:98.2 F (36.8 C), Max:98.7 F (37.1 C)  Recent Labs  Lab 04/04/18 2330  WBC 9.5  CREATININE 0.69  LATICACIDVEN 1.0    Estimated Creatinine Clearance: 114.5 mL/min (by C-G formula based on SCr of 0.69 mg/dL).    Allergies  Allergen Reactions  . Shellfish Allergy     Antimicrobials this admission: Clindamycin 6/6 >> 6/6 Ceftriaxone 6/6 >> Vancomycin 6/6 >>  Dose adjustments this admission:   Microbiology results: 6/5 BCx: NG <24h  6/6 BCx: NG <24h  6/6 WoundCx: Sent    Thank you for allowing pharmacy to be a part of this patient's care.  Cleopatra CedarStephanie Zaneta Lightcap, PharmD Pharmacy Resident  04/05/2018 3:12 PM

## 2018-04-06 ENCOUNTER — Inpatient Hospital Stay: Payer: Medicare Other | Admitting: Anesthesiology

## 2018-04-06 ENCOUNTER — Inpatient Hospital Stay: Payer: Self-pay

## 2018-04-06 ENCOUNTER — Encounter
Admission: EM | Disposition: A | Discharge status: Home Health | Payer: Self-pay | Source: Home / Self Care | Attending: Internal Medicine

## 2018-04-06 ENCOUNTER — Encounter: Payer: Self-pay | Admitting: Anesthesiology

## 2018-04-06 HISTORY — PX: INCISION AND DRAINAGE: SHX5863

## 2018-04-06 LAB — CBC
HEMATOCRIT: 43.1 % (ref 40.0–52.0)
Hemoglobin: 14.5 g/dL (ref 13.0–18.0)
MCH: 28.2 pg (ref 26.0–34.0)
MCHC: 33.7 g/dL (ref 32.0–36.0)
MCV: 83.9 fL (ref 80.0–100.0)
Platelets: 314 10*3/uL (ref 150–440)
RBC: 5.14 MIL/uL (ref 4.40–5.90)
RDW: 14.2 % (ref 11.5–14.5)
WBC: 9.9 10*3/uL (ref 3.8–10.6)

## 2018-04-06 LAB — CBC WITH DIFFERENTIAL/PLATELET
Basophils Absolute: 0.1 10*3/uL (ref 0–0.1)
Basophils Relative: 1 %
EOS PCT: 2 %
Eosinophils Absolute: 0.2 10*3/uL (ref 0–0.7)
HCT: 46.6 % (ref 40.0–52.0)
Hemoglobin: 15.6 g/dL (ref 13.0–18.0)
LYMPHS ABS: 1.3 10*3/uL (ref 1.0–3.6)
Lymphocytes Relative: 10 %
MCH: 27.8 pg (ref 26.0–34.0)
MCHC: 33.4 g/dL (ref 32.0–36.0)
MCV: 83.4 fL (ref 80.0–100.0)
MONO ABS: 0.4 10*3/uL (ref 0.2–1.0)
MONOS PCT: 4 %
NEUTROS ABS: 10.7 10*3/uL — AB (ref 1.4–6.5)
Neutrophils Relative %: 83 %
PLATELETS: 373 10*3/uL (ref 150–440)
RBC: 5.59 MIL/uL (ref 4.40–5.90)
RDW: 14.3 % (ref 11.5–14.5)
WBC: 12.7 10*3/uL — ABNORMAL HIGH (ref 3.8–10.6)

## 2018-04-06 LAB — BASIC METABOLIC PANEL
Anion gap: 8 (ref 5–15)
BUN: 11 mg/dL (ref 6–20)
CALCIUM: 8.8 mg/dL — AB (ref 8.9–10.3)
CO2: 26 mmol/L (ref 22–32)
CREATININE: 0.69 mg/dL (ref 0.61–1.24)
Chloride: 105 mmol/L (ref 101–111)
GFR calc Af Amer: >60 mL/min (ref >60)
GFR calc non Af Amer: >60 mL/min (ref >60)
GLUCOSE: 113 mg/dL — AB (ref 65–99)
Potassium: 3.9 mmol/L (ref 3.5–5.1)
Sodium: 139 mmol/L (ref 135–145)

## 2018-04-06 LAB — HIV ANTIBODY (ROUTINE TESTING W REFLEX): HIV Screen 4th Generation wRfx: NONREACTIVE

## 2018-04-06 LAB — VANCOMYCIN, TROUGH: Vancomycin Tr: 13 ug/mL — ABNORMAL LOW (ref 15–20)

## 2018-04-06 SURGERY — INCISION AND DRAINAGE
Anesthesia: General | Site: Hand | Laterality: Left | Wound class: Dirty or Infected

## 2018-04-06 MED ORDER — LACTATED RINGERS IV SOLN
INTRAVENOUS | Status: DC
  Administered 2018-04-06: via INTRAVENOUS

## 2018-04-06 MED ORDER — NITROGLYCERIN 2 % TD OINT: 1.0000 [in_us] | TOPICAL_OINTMENT | Freq: Four times a day (QID) | TRANSDERMAL | Status: DC | PRN

## 2018-04-06 MED ORDER — METOPROLOL TARTRATE 5 MG/5ML IV SOLN: 10.0000 mg | Freq: Once | INTRAVENOUS | Status: DC

## 2018-04-06 MED ORDER — PROPOFOL 10 MG/ML IV BOLUS
INTRAVENOUS | Status: DC | PRN
  Administered 2018-04-06: 150 mg via INTRAVENOUS

## 2018-04-06 MED ORDER — FENTANYL CITRATE (PF) 100 MCG/2ML IJ SOLN
INTRAMUSCULAR | Status: AC
  Filled 2018-04-06: qty 2

## 2018-04-06 MED ORDER — FENTANYL CITRATE (PF) 100 MCG/2ML IJ SOLN
50.0000 ug | Freq: Once | INTRAMUSCULAR | Status: AC
  Administered 2018-04-06: 50 ug via INTRAVENOUS

## 2018-04-06 MED ORDER — BACITRACIN 50000 UNITS IM SOLR
INTRAMUSCULAR | Status: AC
  Filled 2018-04-06: qty 2

## 2018-04-06 MED ORDER — FENTANYL CITRATE (PF) 100 MCG/2ML IJ SOLN
INTRAMUSCULAR | Status: DC | PRN
  Administered 2018-04-06: 100 ug via INTRAVENOUS

## 2018-04-06 MED ORDER — METOCLOPRAMIDE HCL 5 MG/ML IJ SOLN: 5.0000 mg | Freq: Three times a day (TID) | INTRAMUSCULAR | Status: DC | PRN

## 2018-04-06 MED ORDER — DEXAMETHASONE SODIUM PHOSPHATE 10 MG/ML IJ SOLN
INTRAMUSCULAR | Status: DC | PRN
  Administered 2018-04-06: 10 mg via INTRAVENOUS

## 2018-04-06 MED ORDER — SODIUM CHLORIDE 0.9 % IR SOLN
Status: DC | PRN
  Administered 2018-04-06: 1000 mL

## 2018-04-06 MED ORDER — FENTANYL CITRATE (PF) 100 MCG/2ML IJ SOLN
INTRAMUSCULAR | Status: AC
  Administered 2018-04-06: 50 ug via INTRAVENOUS
  Filled 2018-04-06: qty 2

## 2018-04-06 MED ORDER — LABETALOL HCL 5 MG/ML IV SOLN
INTRAVENOUS | Status: DC | PRN
  Administered 2018-04-06: 10 mg via INTRAVENOUS

## 2018-04-06 MED ORDER — ENALAPRILAT 1.25 MG/ML IV SOLN
1.2500 mg | Freq: Four times a day (QID) | INTRAVENOUS | Status: DC | PRN
  Filled 2018-04-06: qty 1

## 2018-04-06 MED ORDER — AMLODIPINE BESYLATE 5 MG PO TABS: 5.0000 mg | ORAL_TABLET | Freq: Every day | ORAL | Status: DC

## 2018-04-06 MED ORDER — HYDRALAZINE HCL 20 MG/ML IJ SOLN
10.0000 mg | Freq: Four times a day (QID) | INTRAMUSCULAR | Status: DC | PRN
  Administered 2018-04-06: 10 mg via INTRAVENOUS
  Filled 2018-04-06: qty 1

## 2018-04-06 MED ORDER — KETOROLAC TROMETHAMINE 15 MG/ML IJ SOLN
15.0000 mg | Freq: Four times a day (QID) | INTRAMUSCULAR | Status: DC
  Administered 2018-04-07 (×3): 15 mg via INTRAVENOUS
  Filled 2018-04-06 (×3): qty 1

## 2018-04-06 MED ORDER — METOPROLOL TARTRATE 5 MG/5ML IV SOLN: 5.0000 mg | Freq: Four times a day (QID) | INTRAVENOUS | Status: DC | PRN

## 2018-04-06 MED ORDER — AMLODIPINE BESYLATE 5 MG PO TABS
5.0000 mg | ORAL_TABLET | Freq: Every day | ORAL | Status: DC
  Administered 2018-04-06: 5 mg via ORAL
  Filled 2018-04-06: qty 1

## 2018-04-06 MED ORDER — METOCLOPRAMIDE HCL 10 MG PO TABS: 5.0000 mg | ORAL_TABLET | Freq: Three times a day (TID) | ORAL | Status: DC | PRN

## 2018-04-06 MED ORDER — DOCUSATE SODIUM 100 MG PO CAPS
100.0000 mg | ORAL_CAPSULE | Freq: Two times a day (BID) | ORAL | Status: DC
  Administered 2018-04-06 – 2018-04-07 (×2): 100 mg via ORAL
  Filled 2018-04-06 (×2): qty 1

## 2018-04-06 MED ORDER — ONDANSETRON HCL 4 MG/2ML IJ SOLN
INTRAMUSCULAR | Status: AC
  Filled 2018-04-06: qty 2

## 2018-04-06 MED ORDER — DEXAMETHASONE SODIUM PHOSPHATE 10 MG/ML IJ SOLN
INTRAMUSCULAR | Status: AC
  Filled 2018-04-06: qty 1

## 2018-04-06 MED ORDER — FENTANYL CITRATE (PF) 100 MCG/2ML IJ SOLN
25.0000 ug | INTRAMUSCULAR | Status: DC | PRN
  Administered 2018-04-06 (×3): 50 ug via INTRAVENOUS

## 2018-04-06 MED ORDER — SODIUM CHLORIDE 0.9 % IV SOLN
1.0000 g | INTRAVENOUS | Status: DC
  Administered 2018-04-06: 1 g via INTRAVENOUS
  Filled 2018-04-06: qty 1
  Filled 2018-04-06: qty 10

## 2018-04-06 MED ORDER — LIDOCAINE HCL (CARDIAC) PF 100 MG/5ML IV SOSY
PREFILLED_SYRINGE | INTRAVENOUS | Status: DC | PRN
  Administered 2018-04-06: 100 mg via INTRAVENOUS

## 2018-04-06 MED ORDER — VANCOMYCIN HCL IN DEXTROSE 1-5 GM/200ML-% IV SOLN
1000.0000 mg | Freq: Three times a day (TID) | INTRAVENOUS | Status: DC
Start: 2018-04-06 — End: 2018-04-07
  Administered 2018-04-07 (×3): 1000 mg via INTRAVENOUS
  Filled 2018-04-06 (×6): qty 200

## 2018-04-06 MED ORDER — MIDAZOLAM HCL 2 MG/2ML IJ SOLN
INTRAMUSCULAR | Status: AC
  Filled 2018-04-06: qty 2

## 2018-04-06 MED ORDER — NITROGLYCERIN 2 % TD OINT: 1.0000 [in_us] | TOPICAL_OINTMENT | Freq: Four times a day (QID) | TRANSDERMAL | Status: DC

## 2018-04-06 MED ORDER — LABETALOL HCL 5 MG/ML IV SOLN
INTRAVENOUS | Status: AC
  Filled 2018-04-06: qty 4

## 2018-04-06 MED ORDER — ONDANSETRON HCL 4 MG/2ML IJ SOLN
INTRAMUSCULAR | Status: DC | PRN
  Administered 2018-04-06: 4 mg via INTRAVENOUS

## 2018-04-06 MED ORDER — LACTATED RINGERS IV SOLN
INTRAVENOUS | Status: DC | PRN
  Administered 2018-04-06: 20:00:00 via INTRAVENOUS

## 2018-04-06 MED ORDER — AMLODIPINE BESYLATE 10 MG PO TABS
10.0000 mg | ORAL_TABLET | Freq: Every day | ORAL | Status: DC
  Administered 2018-04-07: 10 mg via ORAL
  Filled 2018-04-06: qty 1

## 2018-04-06 MED ORDER — PROMETHAZINE HCL 25 MG/ML IJ SOLN: 6.2500 mg | INTRAMUSCULAR | Status: DC | PRN

## 2018-04-06 MED ORDER — AMLODIPINE BESYLATE 5 MG PO TABS
5.0000 mg | ORAL_TABLET | Freq: Once | ORAL | Status: AC
  Administered 2018-04-06: 5 mg via ORAL
  Filled 2018-04-06: qty 1

## 2018-04-06 MED ORDER — PROPOFOL 10 MG/ML IV BOLUS
INTRAVENOUS | Status: AC
  Filled 2018-04-06: qty 20

## 2018-04-06 MED ORDER — MIDAZOLAM HCL 2 MG/2ML IJ SOLN
INTRAMUSCULAR | Status: DC | PRN
  Administered 2018-04-06: 2 mg via INTRAVENOUS

## 2018-04-06 SURGICAL SUPPLY — 40 items
BANDAGE ELASTIC 3 LF NS (GAUZE/BANDAGES/DRESSINGS) ×2 IMPLANT
BNDG COHESIVE 4X5 TAN STRL (GAUZE/BANDAGES/DRESSINGS) ×2 IMPLANT
BNDG GAUZE 4.5X4.1 6PLY STRL (MISCELLANEOUS) ×2 IMPLANT
BRUSH SCRUB EZ  4% CHG (MISCELLANEOUS) ×2
BRUSH SCRUB EZ 4% CHG (MISCELLANEOUS) ×2 IMPLANT
CANISTER SUCT 1200ML W/VALVE (MISCELLANEOUS) IMPLANT
CANISTER SUCT 3000ML PPV (MISCELLANEOUS) ×4 IMPLANT
CAST PADDING 2X4YD NS (MISCELLANEOUS) IMPLANT
CAST PADDING 2X4YD ST 30245 (MISCELLANEOUS) ×1
CHLORAPREP W/TINT 26ML (MISCELLANEOUS) ×2 IMPLANT
DRAIN PENROSE 1/4X12 LTX (DRAIN) ×2 IMPLANT
DRAPE INCISE IOBAN 66X60 STRL (DRAPES) ×2 IMPLANT
DRAPE SHEET LG 3/4 BI-LAMINATE (DRAPES) ×2 IMPLANT
DRAPE SURG 17X11 SM STRL (DRAPES) ×2 IMPLANT
DRAPE U-SHAPE 47X51 STRL (DRAPES) ×2 IMPLANT
ELECT REM PT RETURN 9FT ADLT (ELECTROSURGICAL) ×2
ELECTRODE REM PT RTRN 9FT ADLT (ELECTROSURGICAL) ×1 IMPLANT
GAUZE 4X4 16PLY RFD (DISPOSABLE) ×2 IMPLANT
GAUZE XEROFORM 4X4 STRL (GAUZE/BANDAGES/DRESSINGS) ×2 IMPLANT
GLOVE INDICATOR 8.0 STRL GRN (GLOVE) ×4 IMPLANT
GLOVE SURG ORTHO 8.0 STRL STRW (GLOVE) ×4 IMPLANT
GOWN STRL REUS W/ TWL LRG LVL3 (GOWN DISPOSABLE) ×1 IMPLANT
GOWN STRL REUS W/ TWL XL LVL3 (GOWN DISPOSABLE) ×1 IMPLANT
GOWN STRL REUS W/TWL LRG LVL3 (GOWN DISPOSABLE) ×1
GOWN STRL REUS W/TWL XL LVL3 (GOWN DISPOSABLE) ×1
KIT TURNOVER KIT A (KITS) ×2 IMPLANT
NS IRRIG 1000ML POUR BTL (IV SOLUTION) ×6 IMPLANT
PACK EXTREMITY ARMC (MISCELLANEOUS) ×2 IMPLANT
PADDING CAST COTTON 2X4 ST (MISCELLANEOUS) ×1 IMPLANT
STAPLER SKIN PROX 35W (STAPLE) ×2 IMPLANT
STOCKINETTE IMPERVIOUS 9X36 MD (GAUZE/BANDAGES/DRESSINGS) ×2 IMPLANT
SUT ETHILON 2 0 FSLX (SUTURE) ×2 IMPLANT
SUT ETHILON NAB PS2 4-0 18IN (SUTURE) IMPLANT
SUT PDS AB 2-0 CT1 27 (SUTURE) IMPLANT
SUT VIC AB 1 CT1 36 (SUTURE) IMPLANT
SUT VIC AB 2-0 CT1 27 (SUTURE)
SUT VIC AB 2-0 CT1 TAPERPNT 27 (SUTURE) IMPLANT
SUT VICRYL AB 3-0 FS1 BRD 27IN (SUTURE) IMPLANT
SWAB DUAL CULTURE TRANS RED ST (MISCELLANEOUS) ×2 IMPLANT
TOWEL OR 17X26 4PK STRL BLUE (TOWEL DISPOSABLE) ×2 IMPLANT

## 2018-04-06 NOTE — Anesthesia Procedure Notes (Signed)
Procedure Name: LMA Insertion Date/Time: 04/06/2018 8:04 PM Performed by: Irving BurtonBachich, Reilley Valentine, CRNA Pre-anesthesia Checklist: Patient identified, Emergency Drugs available, Suction available and Patient being monitored Patient Re-evaluated:Patient Re-evaluated prior to induction Oxygen Delivery Method: Circle system utilized Preoxygenation: Pre-oxygenation with 100% oxygen Induction Type: IV induction Ventilation: Mask ventilation without difficulty LMA: LMA inserted LMA Size: 4.5 Number of attempts: 1 Placement Confirmation: positive ETCO2 and breath sounds checked- equal and bilateral Tube secured with: Tape Dental Injury: Teeth and Oropharynx as per pre-operative assessment

## 2018-04-06 NOTE — Progress Notes (Signed)
Spoke with IV team, due to patient being in the OR at this time, IV team will not be able to complete PICC line today.  No coverage of PICCs on weekends, so if a PICC line is needed for discharge, [facility] must be consulted through weekend, otherwise patient can have PICC placed on Monday by IV team.

## 2018-04-06 NOTE — Progress Notes (Signed)
[facility] - [place] [facility]   PATIENT NAME: Manuel Gregory    MR#:  [id number]  DATE OF BIRTH:  01/04/1961  SUBJECTIVE:  CHIEF COMPLAINT:  Pt has left hand pain and swelling, somewhat better than yesterday but still has purulent discharge  REVIEW OF SYSTEMS:  CONSTITUTIONAL: No fever, fatigue or weakness.  EYES: No blurred or double vision.  EARS, NOSE, AND THROAT: No tinnitus or ear pain.  RESPIRATORY: No cough, shortness of breath, wheezing or hemoptysis.  CARDIOVASCULAR: No chest pain, orthopnea, edema.  GASTROINTESTINAL: No nausea, vomiting, diarrhea or abdominal pain.  GENITOURINARY: No dysuria, hematuria.  ENDOCRINE: No polyuria, nocturia,  HEMATOLOGY: No anemia, easy bruising or bleeding SKIN: No rash or lesion. MUSCULOSKELETAL: Left hand pain and swelling no joint pain or arthritis.   NEUROLOGIC: No tingling, numbness, weakness.  PSYCHIATRY: No anxiety or depression.   DRUG ALLERGIES:   Allergies  Allergen Reactions  . Shellfish Allergy     VITALS:  Blood pressure (!) 171/107, pulse (!) 52, temperature 98.5 F (36.9 C), temperature source Oral, resp. rate 16, height 6' (1.829 m), weight 80.5 kg (177 lb 7.5 oz), SpO2 98 %.  PHYSICAL EXAMINATION:  GENERAL:  57 y.o.-year-old patient lying in the bed with no acute distress.  EYES: Pupils equal, round, reactive to light and accommodation. No scleral icterus. Extraocular muscles intact.  HEENT: Head atraumatic, normocephalic. Oropharynx and nasopharynx clear.  NECK:  Supple, no jugular venous distention. No thyroid enlargement, no tenderness.  LUNGS: Normal breath sounds bilaterally, no wheezing, rales,rhonchi or crepitation. No use of accessory muscles of respiration.  CARDIOVASCULAR: S1, S2 normal. No murmurs, rubs, or gallops.  ABDOMEN: Soft, nontender, nondistended. Bowel sounds present. No organomegaly or mass.  EXTREMITIES: Left hand is erythematous, swollen and tender.  Purulent  discharge noticed   no pedal edema, cyanosis, or clubbing.  NEUROLOGIC: Cranial nerves II through XII are intact. Muscle strength 5/5 in all extremities except left hand sensation intact. Gait not checked.  PSYCHIATRIC: The patient is alert and oriented x 3.  SKIN: No obvious rash, lesion, or ulcer.    LABORATORY PANEL:   CBC Recent Labs  Lab 04/06/18 0535  WBC 9.9  HGB 14.5  HCT 43.1  PLT 314   ------------------------------------------------------------------------------------------------------------------  Chemistries  Recent Labs  Lab 04/04/18 2330 04/06/18 0535  NA 137 139  K 3.6 3.9  CL 103 105  CO2 25 26  GLUCOSE 98 113*  BUN 13 11  CREATININE 0.69 0.69  CALCIUM 8.9 8.8*  AST 23  --   ALT 20  --   ALKPHOS 51  --   BILITOT 0.5  --    ------------------------------------------------------------------------------------------------------------------  Cardiac Enzymes No results for input(s): TROPONINI in the last 168 hours. ------------------------------------------------------------------------------------------------------------------  RADIOLOGY:  Ct Hand Left Wo Contrast  Result Date: 04/05/2018 CLINICAL DATA:  Acute onset of left hand pain and swelling. EXAM: CT OF THE LEFT HAND WITHOUT CONTRAST TECHNIQUE: Multidetector CT imaging of the left hand was performed according to the standard protocol. Multiplanar CT image reconstructions were also generated. COMPARISON:  Left hand radiographs performed earlier today at 1:20 a.m. FINDINGS: Bones/Joint/Cartilage Apparent mild osseous erosion is noted at the radial aspect of the base of the fifth proximal phalanx, and also at multiple locations about the distal aspect of the fifth metacarpal, raising concern for osteomyelitis. As noted on radiograph, there is mild ulnar subluxation at the fifth metacarpophalangeal joint. An underlying large joint effusion is noted. The cartilage is not well  assessed on CT. Ligaments  Suboptimally assessed by CT. Muscles and Tendons There is diffusely decreased attenuation with regard to the musculature about the fifth metacarpal, and vague edema about the musculature of the hypothenar eminence, compatible with myositis and soft tissue infection. No definite tenosynovitis is characterized on CT. The carpal tunnel is grossly unremarkable in appearance. Soft tissues Diffuse soft tissue edema is seen tracking along the dorsum of the hand, most prominent about the fifth metacarpal and fifth metacarpophalangeal joint. The vasculature is not well assessed without contrast. IMPRESSION: 1. Apparent mild osseous erosion at the radial aspect of the base of the fifth proximal phalanx, and also at multiple locations about the distal aspect of the fifth metacarpal, raising concern for osteomyelitis. As noted on radiograph, there is mild ulnar subluxation at the fifth metacarpophalangeal joint. Underlying large joint effusion noted. 2. Myositis and soft tissue infection noted with regard to the musculature about the fifth metacarpal and musculature of the hypothenar eminence. 3. Diffuse soft tissue edema tracking along the dorsum of the hand, most prominent about the fifth metacarpal and fifth metacarpophalangeal joint. Electronically Signed   By: Roanna Raider M.D.   On: 04/05/2018 04:23   Dg Hand Complete Left  Result Date: 04/05/2018 CLINICAL DATA:  Left hand pain and swelling.  Osteomyelitis. EXAM: LEFT HAND - COMPLETE 3+ VIEW COMPARISON:  None. FINDINGS: Soft tissue edema with small soft tissue defect about the ulnar aspect of the hand in the region of the metacarpal phalangeal joint. No tracking soft tissue air or radiopaque foreign body. Fifth digit held in slight flexion the proximal interphalangeal joint. Questionable fracture at the base of the fifth proximal phalanx at the metacarpal phalangeal joint. Bony destructive change, osseous erosions or abnormal density to suggest osteomyelitis.  IMPRESSION: Possible fracture at the base of the fifth proximal phalanx at the metacarpal phalangeal joint. No bony destructive change to suggest osteomyelitis. Diffuse soft tissue edema with skin defect about the ulnar aspect of the hand. Electronically Signed   By: Rubye Oaks M.D.   On: 04/05/2018 02:08    EKG:   Orders placed or performed during the hospital encounter of 07/21/16  . EKG 12-Lead  . EKG 12-Lead  . ED EKG  . ED EKG  . EKG    ASSESSMENT AND PLAN:     17M L hand community-acquired purulent cellulitis w/ possible osteomyelitis.  #Left hand cellulitis following an insect bite 10 to 14 days ago CT scan has revealed possible joint infection and myositis -IV antibiotics clindamycin and vancomycin Seen by infectious disease, appreciate Dr. Sampson Goon recommendations discussed with Dr. Odis Luster , scheduled for handwashing and debridement if needed -Pain ctrl as needed -c/w home meds -Cardiac diet  #Essential hypertension-blood pressure elevated  continue lisinopril, amlodipine dose increased IV hydralazine as needed  #Gout-no flareups not on any home medications  -Lovenox -Full code -Admission, > 2 midnights        All the records are reviewed and case discussed with Care Management/Social Workerr. Management plans discussed with the patient, family and they are in agreement.  CODE STATUS: fc  TOTAL TIME TAKING CARE OF THIS PATIENT:  33 minutes.   POSSIBLE D/C IN 1-2  DAYS, DEPENDING ON CLINICAL CONDITION.  Note: This dictation was prepared with Dragon dictation along with smaller phrase technology. Any transcriptional errors that result from this process are unintentional.   Ramonita Lab M.D on 04/06/2018 at 2:20 PM  Between 7am to 6pm - Pager - [phone] After 6pm go to  [email or web address] - password [email or web address] [facility]  Office  [phone]  CC: Primary care physician; Patient, No Pcp Per

## 2018-04-06 NOTE — Op Note (Addendum)
04/06/2018  8:51 PM  PATIENT:  Manuel Gregory    PRE-OPERATIVE DIAGNOSIS:  left hand infection  POST-OPERATIVE DIAGNOSIS:  Same  PROCEDURE:  INCISION AND DRAINAGE LEFT HAND  SURGEON:  Lyndle HerrlichJames R Bird Swetz, MD  ANESTHESIA:   General  PREOPERATIVE INDICATIONS:  Manuel Gregory is a  57 y.o. male with a diagnosis of left hand infection who presents for urgent irrigation and debridement secondary to aggressive deep infection.  The risks benefits and alternatives were discussed with the patient preoperatively including but not limited to the risks of infection, bleeding, nerve injury, cardiopulmonary complications, the need for revision surgery, among others, and the patient was willing to proceed.  EBL: less than 25 mL   TOURNIQUET TIME: 9 MIN  OPERATIVE FINDINGS: erythema, blistering and drainage at the dorsum of 5th metacarpal phalangeal joint left hand  OPERATIVE PROCEDURE: The patient was brought to the operating room and underwent satisfactory general LMA anesthesia. The operative arm was prepped and draped sterilly and tourniquet inflated to 250mm Hg. A Bruner incision was made just proximal and distal to the infected area. The deep web space was opened and copious amounts of pus was expressed. The deep fluid was passed from the field and sent for gram stain, culture and sensitivity. The skin edges and subcutaneous tissues were debrided sharply with scalpel and tenotomy scissors. Copious amounts of bacitracin laced normal saline was irrigated and there was good perfusion of the tissues. The incision was loosely closed with 3-0 nylon suture and penrose drain was left deep. A dry sterile dressing was applied and the tourniquet released with good return of blood flow to the hand. The patient was awakened and taken to the PACU in good condition.

## 2018-04-06 NOTE — Progress Notes (Signed)
Per Iris, RN patient is in the OR.  Patient has a PICC ordered that will not be able placed tonight due to being in the OR.  Iris, RN aware that if PICC is needed over weekend [facility] will need to be called.  If patient is still an inpatient on Monday, IV team will place PICC.  Iris, RN states that she will notify MD.  Gasper LloydKerry Ariday Brinker, RN VAST

## 2018-04-06 NOTE — Progress Notes (Signed)
Unable to have a phone conversation with Dr. Amado CoeGouru d/t poor connection. Multiple unsuccessful attempts were made. Dr. Amado CoeGouru left Nitro paste order with another RN.   OR called for pick up. Consent signed.

## 2018-04-06 NOTE — Progress Notes (Addendum)
Pharmacy Antibiotic Note  Manuel Gregory is a 57 y.o. male admitted on 04/05/2018 with Osteomyelitis. Admitted with L hand pain and swelling following an insect bite on 5/23. Was seen previously in the ED and started on steroid taper. Worsened and now admitted and started initially on clindamycin and ceftriaxone.   Pharmacy has been consulted for vancomycin dosing.  Plan: Continue ceftriaxone 1g IV daily   Vancomycin 1g IV x once followed by Vancomycin 1750mg  IV q12h with 6 hour stack dose. Estimated Cmin 17. Will check vanc trough before 4th dose. Will adjust dose as needed per consult.   Ke 0.099, t1/2 7, Vd 54.32  06/07 @ 2200 VT 13 subtherapeutic from a goal trough of 15 - 20 mcg/mL (osteomyelitis). Will switch regimen to vanc 1g IV q8h and check a trough 06/09 @ 1800. Ke 0.093 t1/2 7 ~ 8 hours, 15 mg/kg dose ~ 1250, will see how patient does w/ 1g for now. Est Cmin ~ 15 mcg/mL  Height: 6' (182.9 cm) Weight: 177 lb 7.5 oz (80.5 kg) IBW/kg (Calculated) : 77.6  Temp (24hrs), Avg:98.4 F (36.9 C), Min:97.7 F (36.5 C), Max:99.1 F (37.3 C)  Recent Labs  Lab 04/04/18 2330 04/06/18 0535 04/06/18 2211  WBC 9.5 9.9 12.7*  CREATININE 0.69 0.69  --   LATICACIDVEN 1.0  --   --   VANCOTROUGH  --   --  13*    Estimated Creatinine Clearance: 113.2 mL/min (by C-G formula based on SCr of 0.69 mg/dL).    Allergies  Allergen Reactions  . Hydralazine Shortness Of Breath  . Shellfish Allergy     Antimicrobials this admission: Clindamycin 6/6 >> 6/6 Ceftriaxone 6/6 >> Vancomycin 6/6 >>  Dose adjustments this admission:   Microbiology results: 6/5 BCx: NG <24h  6/6 BCx: NG <24h  6/6 WoundCx: Sent    Thank you for allowing pharmacy to be a part of this patient's care.  Thomasene Rippleavid  Lova Urbieta, PharmD Pharmacy Resident  04/06/2018 11:25 PM

## 2018-04-06 NOTE — Progress Notes (Signed)
Repaged Dr. Wyn ForsterGouru.unable to give lopressor d/t bradycardia. Awaiting response

## 2018-04-06 NOTE — Anesthesia Post-op Follow-up Note (Signed)
Anesthesia QCDR form completed.        

## 2018-04-06 NOTE — Progress Notes (Signed)
Hospitalist notified of elevated BP, trending high; Acknowledged; new order written. Windy Carinaurner,Dejour Vos K, RN 5:10 AM 04/06/2018

## 2018-04-06 NOTE — Progress Notes (Signed)
Focused assessment done. Refer to charting 

## 2018-04-06 NOTE — Progress Notes (Signed)
[facility] CLINIC INFECTIOUS DISEASE PROGRESS NOTE Date of Admission:  04/05/2018     ID: Manuel Gregory is a 57 y.o. male with  Hand infection  Active Problems:   Cellulitis and abscess of hand   Subjective: No fevers, a little less oain  ROS  Eleven systems are reviewed and negative except per hpi  Medications:  Antibiotics Given (last 72 hours)    Date/Time Action Medication Dose Rate   04/05/18 0142 New Bag/Given   cefTRIAXone (ROCEPHIN) 1 g in sodium chloride 0.9 % 100 mL IVPB 1 g 200 mL/hr   04/05/18 0201 New Bag/Given   clindamycin (CLEOCIN) IVPB 600 mg 600 mg 100 mL/hr   04/05/18 1046 New Bag/Given   clindamycin (CLEOCIN) IVPB 600 mg 600 mg 100 mL/hr   04/05/18 1546 New Bag/Given   vancomycin (VANCOCIN) IVPB 1000 mg/200 mL premix 1,000 mg 200 mL/hr   04/05/18 2149 New Bag/Given   vancomycin (VANCOCIN) 1,750 mg in sodium chloride 0.9 % 500 mL IVPB 1,750 mg 250 mL/hr   04/06/18 0957 New Bag/Given   vancomycin (VANCOCIN) 1,750 mg in sodium chloride 0.9 % 500 mL IVPB 1,750 mg 250 mL/hr     . [START ON 04/07/2018] amLODipine  10 mg Oral Daily  . enoxaparin (LOVENOX) injection  40 mg Subcutaneous Q24H  . lisinopril  40 mg Oral Daily    Objective: Vital signs in last 24 hours: Temp:  [98.5 F (36.9 C)-99.1 F (37.3 C)] 98.5 F (36.9 C) (06/07 1400) Pulse Rate:  [45-102] 102 (06/07 1503) Resp:  [16] 16 (06/07 0504) BP: (160-188)/(104-119) 188/111 (06/07 1459) SpO2:  [97 %-100 %] 97 % (06/07 1503) Constitutional: He is oriented to person, place, and time. He appears well-developed and well-nourished. No distress.  HENT:  Mouth/Throat: Oropharynx is clear and moist. No oropharyngeal exudate.  Cardiovascular: Normal rate, regular rhythm and normal heart sounds. Exam reveals no gallop and no friction rub.  No murmur heard.  Pulmonary/Chest: Effort normal and breath sounds normal. No respiratory distress. He has no wheezes.  Abdominal: Soft. Bowel sounds are normal. He exhibits  no distension. There is no tenderness.  Lymphadenopathy: He has no cervical adenopathy.  Ext L hand with swelling over dorsum of lateral hand with a 1 cm wound over MCP joint on 5th finger. Purulence draiaing from it. Limited ability to flex 5th finger.  Neurological: He is alert and oriented to person, place, and time.  Skin: wound and erythema as above Psychiatric: He has a normal mood and affect. His behavior is normal.    Lab Results Recent Labs    04/04/18 2330 04/06/18 0535  WBC 9.5 9.9  HGB 13.8 14.5  HCT 39.6* 43.1  NA 137 139  K 3.6 3.9  CL 103 105  CO2 25 26  BUN 13 11  CREATININE 0.69 0.69    Microbiology: Results for orders placed or performed during the hospital encounter of 04/05/18  Blood culture (routine x 2)     Status: None (Preliminary result)   Collection Time: 04/04/18 11:30 PM  Result Value Ref Range Status   Specimen Description BLOOD LEFT FA  Final   Special Requests   Final    BOTTLES DRAWN AEROBIC AND ANAEROBIC Blood Culture results may not be optimal due to an excessive volume of blood received in culture bottles   Culture   Final    NO GROWTH 1 DAY Performed at [facility], [address], [place], [place] [address]    Report Status  PENDING  Incomplete  Blood culture (routine x 2)     Status: None (Preliminary result)   Collection Time: 04/05/18  1:41 AM  Result Value Ref Range Status   Specimen Description BLOOD RIGHT AC  Final   Special Requests   Final    BOTTLES DRAWN AEROBIC AND ANAEROBIC Blood Culture adequate volume   Culture   Final    NO GROWTH 1 DAY Performed at [facility], [address] [place], [place] [address]    Report Status PENDING  Incomplete  Aerobic Culture (superficial specimen)     Status: None (Preliminary result)   Collection Time: 04/05/18  2:04 PM  Result Value Ref Range Status   Specimen Description   Final    HAND left hand Performed at [facility], [address]  [place], [place] [address]    Special Requests   Final    Normal Performed at [facility], [address] [place], [place] [address]    Gram Stain   Final    FEW WBC PRESENT, PREDOMINANTLY PMN FEW GRAM POSITIVE COCCI IN PAIRS IN CLUSTERS Performed at [facility], [address] [place], [place] [address]    Culture FEW VIRIDANS STREPTOCOCCUS  Final   Report Status PENDING  Incomplete    Studies/Results: Ct Hand Left Wo Contrast  Result Date: 04/05/2018 CLINICAL DATA:  Acute onset of left hand pain and swelling. EXAM: CT OF THE LEFT HAND WITHOUT CONTRAST TECHNIQUE: Multidetector CT imaging of the left hand was performed according to the standard protocol. Multiplanar CT image reconstructions were also generated. COMPARISON:  Left hand radiographs performed earlier today at 1:20 a.m. FINDINGS: Bones/Joint/Cartilage Apparent mild osseous erosion is noted at the radial aspect of the base of the fifth proximal phalanx, and also at multiple locations about the distal aspect of the fifth metacarpal, raising concern for osteomyelitis. As noted on radiograph, there is mild ulnar subluxation at the fifth metacarpophalangeal joint. An underlying large joint effusion is noted. The cartilage is not well assessed on CT. Ligaments Suboptimally assessed by CT. Muscles and Tendons There is diffusely decreased attenuation with regard to the musculature about the fifth metacarpal, and vague edema about the musculature of the hypothenar eminence, compatible with myositis and soft tissue infection. No definite tenosynovitis is characterized on CT. The carpal tunnel is grossly unremarkable in appearance. Soft tissues Diffuse soft tissue edema is seen tracking along the dorsum of the hand, most prominent about the fifth metacarpal and fifth metacarpophalangeal joint. The vasculature is not well assessed without contrast. IMPRESSION: 1. Apparent mild osseous erosion at the radial aspect of the base of the  fifth proximal phalanx, and also at multiple locations about the distal aspect of the fifth metacarpal, raising concern for osteomyelitis. As noted on radiograph, there is mild ulnar subluxation at the fifth metacarpophalangeal joint. Underlying large joint effusion noted. 2. Myositis and soft tissue infection noted with regard to the musculature about the fifth metacarpal and musculature of the hypothenar eminence. 3. Diffuse soft tissue edema tracking along the dorsum of the hand, most prominent about the fifth metacarpal and fifth metacarpophalangeal joint. Electronically Signed   By: Roanna RaiderJeffery  Chang M.D.   On: 04/05/2018 04:23   Dg Hand Complete Left  Result Date: 04/05/2018 CLINICAL DATA:  Left hand pain and swelling.  Osteomyelitis. EXAM: LEFT HAND - COMPLETE 3+ VIEW COMPARISON:  None. FINDINGS: Soft tissue edema with small soft tissue defect about the ulnar aspect of the hand in  the region of the metacarpal phalangeal joint. No tracking soft tissue air or radiopaque foreign body. Fifth digit held in slight flexion the proximal interphalangeal joint. Questionable fracture at the base of the fifth proximal phalanx at the metacarpal phalangeal joint. Bony destructive change, osseous erosions or abnormal density to suggest osteomyelitis. IMPRESSION: Possible fracture at the base of the fifth proximal phalanx at the metacarpal phalangeal joint. No bony destructive change to suggest osteomyelitis. Diffuse soft tissue edema with skin defect about the ulnar aspect of the hand. Electronically Signed   By: Rubye Oaks M.D.   On: 04/05/2018 02:08    Assessment/Plan: Manuel Gregory is a 57 y.o. male with L hand infection following insect bite or a boil about 10-14 days ago. Now has marked swelling over 4th and 5th knuckles, mod drainage and limited ROM. CT shows possible joint infection and myositis.  Cx with Viridans strep but still pending.  Recommendations Cont vanco and ceftriaxone pending final  cultures Will need PICC line given joint involvement and 4-6 weeks IV abx  Thank you very much for the consult. Will follow with you.  Mick Sell   04/06/2018, 4:07 PM

## 2018-04-06 NOTE — Progress Notes (Signed)
Pt transported to OR

## 2018-04-06 NOTE — Anesthesia Preprocedure Evaluation (Signed)
Anesthesia Evaluation  Patient identified by MRN, date of birth, ID band Patient awake    Reviewed: Allergy & Precautions, H&P , NPO status , Patient's Chart, lab work & pertinent test results, reviewed documented beta blocker date and time   History of Anesthesia Complications Negative for: history of anesthetic complications  Airway Mallampati: III  TM Distance: >3 FB Neck ROM: full    Dental  (+) Dental Advidsory Given, Teeth Intact   Pulmonary neg shortness of breath, neg sleep apnea, neg COPD, neg recent URI, Current Smoker,           Cardiovascular Exercise Tolerance: Good hypertension, (-) angina(-) CAD, (-) Past MI, (-) Cardiac Stents and (-) CABG (-) dysrhythmias (-) Valvular Problems/Murmurs     Neuro/Psych negative neurological ROS  negative psych ROS   GI/Hepatic Neg liver ROS, GERD  ,  Endo/Other  negative endocrine ROS  Renal/GU negative Renal ROS  negative genitourinary   Musculoskeletal   Abdominal   Peds  Hematology negative hematology ROS (+)   Anesthesia Other Findings Past Medical History: No date: Gout No date: Hypertension   Reproductive/Obstetrics negative OB ROS                             Anesthesia Physical Anesthesia Plan  ASA: II  Anesthesia Plan: General   Post-op Pain Management:    Induction: Intravenous  PONV Risk Score and Plan: 1 and Ondansetron and Dexamethasone  Airway Management Planned: LMA  Additional Equipment:   Intra-op Plan:   Post-operative Plan: Extubation in OR  Informed Consent: I have reviewed the patients History and Physical, chart, labs and discussed the procedure including the risks, benefits and alternatives for the proposed anesthesia with the patient or authorized representative who has indicated his/her understanding and acceptance.   Dental Advisory Given  Plan Discussed with: Anesthesiologist, CRNA and  Surgeon  Anesthesia Plan Comments:         Anesthesia Quick Evaluation

## 2018-04-06 NOTE — Progress Notes (Addendum)
Paged Dr. Amado CoeGouru for uncontrolled BP. Ordered Lopressor 10mg  IVP once and Lopressor 5mg  IV q6h PRN for SBP>150, DBP>, 105 and HR >100. To hold with HR <60. Confirmed with Dr Amado CoeGouru if safe to give on floor without tele monitor. Awaiting verification from pharmacy. Will recheck VS

## 2018-04-06 NOTE — Transfer of Care (Signed)
Immediate Anesthesia Transfer of Care Note  Patient: Manuel Gregory  Procedure(s) Performed: INCISION AND DRAINAGE LEFT HAND (Left Hand)  Patient Location: PACU  Anesthesia Type:General  Level of Consciousness: awake and alert   Airway & Oxygen Therapy: Patient connected to face mask oxygen  Post-op Assessment: Post -op Vital signs reviewed and stable  Post vital signs: stable  Last Vitals:  Vitals Value Taken Time  BP 193/116 04/06/2018  8:48 PM  Temp 36.7 C 04/06/2018  8:47 PM  Pulse 59 04/06/2018  8:49 PM  Resp 12 04/06/2018  8:49 PM  SpO2 100 % 04/06/2018  8:49 PM  Vitals shown include unvalidated device data.  Last Pain:  Vitals:   04/06/18 2047  TempSrc: Temporal  PainSc:       Patients Stated Pain Goal: 3 (04/06/18 0615)  Complications: No apparent anesthesia complications

## 2018-04-07 LAB — C-REACTIVE PROTEIN

## 2018-04-07 LAB — SEDIMENTATION RATE: Sed Rate: 13 mm/hr (ref 0–20)

## 2018-04-07 MED ORDER — LISINOPRIL 40 MG PO TABS: 40.0000 mg | ORAL_TABLET | Freq: Every day | ORAL | 2 refills | Status: AC

## 2018-04-07 MED ORDER — SODIUM CHLORIDE 0.9% FLUSH
10.0000 mL | Freq: Two times a day (BID) | INTRAVENOUS | Status: DC
  Administered 2018-04-07: 10 mL

## 2018-04-07 MED ORDER — SODIUM CHLORIDE 0.9% FLUSH: 10.0000 mL | INTRAVENOUS | Status: DC | PRN

## 2018-04-07 MED ORDER — OXYCODONE HCL 5 MG PO TABS: 5.0000 mg | ORAL_TABLET | Freq: Three times a day (TID) | ORAL | 0 refills | Status: AC | PRN

## 2018-04-07 MED ORDER — VANCOMYCIN IV (FOR PTA / DISCHARGE USE ONLY): 2000.0000 mg | Freq: Two times a day (BID) | INTRAVENOUS | 0 refills | Status: AC

## 2018-04-07 MED ORDER — AMLODIPINE BESYLATE 10 MG PO TABS
10.0000 mg | ORAL_TABLET | Freq: Every day | ORAL | 2 refills | Status: AC
Start: 2018-04-08 — End: ?

## 2018-04-07 NOTE — Discharge Summary (Signed)
[name] [facility] [place] NAME: Manuel Gregory    MR#:  [id number]  DATE OF BIRTH:  01-16-1961  DATE OF ADMISSION:  04/05/2018   ADMITTING PHYSICIAN: Arta Silence, MD  DATE OF DISCHARGE:  04/07/18  PRIMARY CARE PHYSICIAN: Patient, No Pcp Per   ADMISSION DIAGNOSIS:   Cellulitis of left upper extremity [X41.287] Skin ulcer of hand, limited to breakdown of skin ([place]) [L98.491] Osteomyelitis of left hand, unspecified type ([place]) [M86.9]  DISCHARGE DIAGNOSIS:   Active Problems:   Cellulitis and abscess of hand   SECONDARY DIAGNOSIS:   Past Medical History:  Diagnosis Date  . Gout   . Hypertension     HOSPITAL COURSE:   57 year old male with past medical history significant for gout and arthritis, hypertension not in any medications presents to hospital secondary to left hand pain and swelling.  1. Left hand cellulitis and osteomyelitis-- unsure of any known injury -Was treated as outpatient with no improvement in his symptoms. -Appreciate ID and orthopedics consult -Antibiotics changed to vancomycin and Rocephin -CT of the hand consistent with mild osseous erosion at the fifth proximal phalanx and multiple locations of fifth metacarpal. -Initial wound cultures growing strep viridans.  Intra-Op cultures are pending -Patient did have operative I&D done on 04/06/2018 and cultures are still pending -Blood cultures are negative. -Will be discharged on IV vancomycin for 4 weeks for osteomyelitis until 05/03/2018  2.  Hypertension-started on Norvasc and lisinopril in the hospital  Independent and ambulatory.  Will be discharged home  DISCHARGE CONDITIONS:   Guarded  CONSULTS OBTAINED:   Treatment Team:  Arta Silence, MD Leonel Ramsay, MD Lovell Sheehan, MD  DRUG ALLERGIES:   Allergies  Allergen Reactions  . Hydralazine Shortness Of Breath  . Shellfish Allergy    DISCHARGE MEDICATIONS:   Allergies as of 04/07/2018       Reactions   Hydralazine Shortness Of Breath   Shellfish Allergy       Medication List    STOP taking these medications   predniSONE 10 MG (21) Tbpk tablet Commonly known as:  STERAPRED UNI-PAK 21 TAB     TAKE these medications   amLODipine 10 MG tablet Commonly known as:  NORVASC Take 1 tablet (10 mg total) by mouth daily. Start taking on:  04/08/2018   lisinopril 40 MG tablet Commonly known as:  PRINIVIL Take 1 tablet (40 mg total) by mouth daily. What changed:  medication strength   oxyCODONE 5 MG immediate release tablet Commonly known as:  ROXICODONE Take 1-2 tablets (5-10 mg total) by mouth every 8 (eight) hours as needed for up to 5 days for moderate pain or severe pain.   vancomycin IVPB Inject 2,000 mg into the vein every 12 (twelve) hours. Indication:  Osteomyelitis Last Day of Therapy:  05/03/18 Labs - Sunday/Monday:  CBC/D, BMP, and vancomycin trough Labs - Thursday:  BMP and vancomycin trough Labs - Every other week:  ESR and CRP First Vanc trough draw on 6/10 before morning dose.            Home Infusion Instuctions  (From admission, onward)        Start     Ordered   04/07/18 0000  Home infusion instructions [facility] May follow [facility] Dosing Protocol; May administer Cathflo as needed to maintain patency of vascular access device.; Flushing of vascular access device: per [facility] Protocol: 0.9% NaCl pre/post medica...    Question Answer Comment  Instructions May follow [facility] Dosing Protocol   Instructions May administer Cathflo as needed to maintain patency of vascular access device.   Instructions Flushing of vascular access device: per [facility] Protocol: 0.9% NaCl pre/post medication administration and prn patency; Heparin 100 u/ml, 78m for implanted ports and Heparin 10u/ml, 519mfor all other central venous catheters.   Instructions May follow [facility] Anaphylaxis Protocol for First Dose Administration in the home: 0.9% NaCl at 25-50 ml/hr  to maintain IV access for protocol meds. Epinephrine 0.3 ml IV/IM PRN and Benadryl 25-50 IV/IM PRN s/s of anaphylaxis.   Instructions [facility] Infusion Coordinator (RN) to assist per patient IV care needs in the home PRN.      04/07/18 1153       DISCHARGE INSTRUCTIONS:   1. ID f/u in 1-2 weeks 2. Orthopedic f/u in 1 week 3. PCP f/u in 1-2 weeks  DIET:   Cardiac diet  ACTIVITY:   Activity as tolerated  OXYGEN:   Home Oxygen: No.  Oxygen Delivery: room air  DISCHARGE LOCATION:   home   If you experience worsening of your admission symptoms, develop shortness of breath, life threatening emergency, suicidal or homicidal thoughts you must seek medical attention immediately by calling 911 or calling your MD immediately  if symptoms less severe.  You Must read complete instructions/literature along with all the possible adverse reactions/side effects for all the Medicines you take and that have been prescribed to you. Take any new Medicines after you have completely understood and accpet all the possible adverse reactions/side effects.   Please note  You were cared for by a hospitalist during your hospital stay. If you have any questions about your discharge medications or the care you received while you were in the hospital after you are discharged, you can call the unit and asked to speak with the hospitalist on call if the hospitalist that took care of you is not available. Once you are discharged, your primary care physician will handle any further medical issues. Please note that NO REFILLS for any discharge medications will be authorized once you are discharged, as it is imperative that you return to your primary care physician (or establish a relationship with a primary care physician if you do not have one) for your aftercare needs so that they can reassess your need for medications and monitor your lab values.    On the day of Discharge:  VITAL SIGNS:   Blood  pressure (!) 141/90, pulse (!) 58, temperature (!) 97.3 F (36.3 C), temperature source Oral, resp. rate 20, height 6' (1.829 m), weight 80.5 kg (177 lb 7.5 oz), SpO2 96 %.  PHYSICAL EXAMINATION:    GENERAL:  [age].o.-year-old patient lying in the bed with no acute distress.  EYES: Pupils equal, round, reactive to light and accommodation. No scleral icterus. Extraocular muscles intact.  HEENT: Head atraumatic, normocephalic. Oropharynx and nasopharynx clear.  NECK:  Supple, no jugular venous distention. No thyroid enlargement, no tenderness.  LUNGS: Normal breath sounds bilaterally, no wheezing, rales,rhonchi or crepitation. No use of accessory muscles of respiration.  CARDIOVASCULAR: S1, S2 normal. No murmurs, rubs, or gallops.  ABDOMEN: Soft, non-tender, non-distended. Bowel sounds present. No organomegaly or mass.  EXTREMITIES: No pedal edema, cyanosis, or clubbing. Left wrist- forearm dressing in place NEUROLOGIC: Cranial nerves II through XII are intact. Muscle strength 5/5 in all extremities. Sensation intact. Gait not checked.  PSYCHIATRIC: The patient is alert and oriented x 3.  SKIN: No obvious  rash, lesion, or ulcer.   DATA REVIEW:   CBC Recent Labs  Lab 04/06/18 2211  WBC 12.7*  HGB 15.6  HCT 46.6  PLT 373    Chemistries  Recent Labs  Lab 04/04/18 2330 04/06/18 0535  NA 137 139  K 3.6 3.9  CL 103 105  CO2 25 26  GLUCOSE 98 113*  BUN 13 11  CREATININE 0.69 0.69  CALCIUM 8.9 8.8*  AST 23  --   ALT 20  --   ALKPHOS 51  --   BILITOT 0.5  --      Microbiology Results  Results for orders placed or performed during the hospital encounter of 04/05/18  Blood culture (routine x 2)     Status: None (Preliminary result)   Collection Time: 04/04/18 11:30 PM  Result Value Ref Range Status   Specimen Description BLOOD LEFT FA  Final   Special Requests   Final    BOTTLES DRAWN AEROBIC AND ANAEROBIC Blood Culture results may not be optimal due to an excessive volume  of blood received in culture bottles   Culture   Final    NO GROWTH 2 DAYS Performed at [facility], [address], [place],  [place] [address]    Report Status PENDING  Incomplete  Blood culture (routine x 2)     Status: None (Preliminary result)   Collection Time: 04/05/18  1:41 AM  Result Value Ref Range Status   Specimen Description BLOOD RIGHT [facility]  Final   Special Requests   Final    BOTTLES DRAWN AEROBIC AND ANAEROBIC Blood Culture adequate volume   Culture   Final    NO GROWTH 2 DAYS Performed at [facility], [address], [place], [place] [address]    Report Status PENDING  Incomplete  Aerobic Culture (superficial specimen)     Status: None (Preliminary result)   Collection Time: 04/05/18  2:04 PM  Result Value Ref Range Status   Specimen Description   Final    HAND left hand Performed at [facility], [address] [place], [place] [address]    Special Requests   Final    Normal Performed at [facility], [address] [place], [place] [address]    Gram Stain   Final    FEW WBC PRESENT, PREDOMINANTLY PMN FEW GRAM POSITIVE COCCI IN PAIRS IN CLUSTERS    Culture   Final    CULTURE REINCUBATED FOR BETTER GROWTH Performed at [facility], [place] [address] [place], [place] [address]    Report Status PENDING  Incomplete  Aerobic/Anaerobic Culture (surgical/deep wound)     Status: None (Preliminary result)   Collection Time: 04/06/18  8:35 PM  Result Value Ref Range Status   Specimen Description   Final    WOUND LEFT HAND Performed at [facility], [address] [place], [place] [address]    Special Requests   Final    NONE Performed at [facility], [address] [place], [place] [address]    Gram Stain   Final    ABUNDANT WBC PRESENT, PREDOMINANTLY PMN RARE GRAM POSITIVE COCCI IN PAIRS RARE GRAM POSITIVE RODS Performed at [facility], [place] [address] [place], [place] [address]      Culture PENDING  Incomplete   Report Status PENDING  Incomplete    RADIOLOGY:  [place] Ekg Site Rite  Result Date: 04/06/2018 If Site Rite image not attached, placement could not be confirmed  due to current cardiac rhythm.    Management plans discussed with the patient, family and they are in agreement.  CODE STATUS:     Code Status Orders  (From admission, onward)        Start     Ordered   04/05/18 0558  Full code  Continuous     04/05/18 0557    Code Status History    This patient has a current code status but no historical code status.    Advance Directive Documentation     Most Recent Value  Type of Advance Directive  Healthcare Power of Attorney  Pre-existing out of facility DNR order (yellow form or pink MOST form)  -  "MOST" Form in Place?  -      TOTAL TIME TAKING CARE OF THIS PATIENT: 38 minutes.    Gladstone Lighter M.D on 04/07/2018 at 12:44 PM  Between 7am to 6pm - Pager - [phone]  After 6pm go to [email or web address] - Proofreader  [employer] [facility]  Office  [phone]  CC: Primary care physician; Patient, No Pcp Per   Note: This dictation was prepared with Dragon dictation along with smaller phrase technology. Any transcriptional errors that result from this process are unintentional.

## 2018-04-07 NOTE — Progress Notes (Signed)
Understands d/c instructions, receiving dose of IV vanc right now. HH to follow.

## 2018-04-07 NOTE — Progress Notes (Signed)
Subjective:  POD #1 s/p I&D of left hand.   Patient RHD.  Patient reports left hand pain as mild.  Pain is much improved as has the motion of his digits.  Objective:   VITALS:   Vitals:   04/06/18 2237 04/06/18 2328 04/07/18 0531 04/07/18 1404  BP: (!) 133/101 138/90 (!) 141/90 137/84  Pulse: 64 60 (!) 58 63  Resp: 18 18 20    Temp: 98.5 F (36.9 C) 97.9 F (36.6 C) (!) 97.3 F (36.3 C) 98.3 F (36.8 C)  TempSrc: Oral Oral Oral Oral  SpO2: 97% 96% 96% 96%  Weight:      Height:        PHYSICAL EXAM: Left hand: Neurovascular intact Sensation intact distally Incision: dressing C/D/I Intact motor function with full ROM of all digits on the left hand  LABS  Results for orders placed or performed during the hospital encounter of 04/05/18 (from the past 24 hour(s))  Aerobic/Anaerobic Culture (surgical/deep wound)     Status: None (Preliminary result)   Collection Time: 04/06/18  8:35 PM  Result Value Ref Range   Specimen Description      WOUND LEFT HAND Performed at [facility], [address] [place], [place] [address]    Special Requests      NONE Performed at [facility], [address] [place], [place] [address]    Gram Stain      ABUNDANT WBC PRESENT, PREDOMINANTLY PMN RARE GRAM POSITIVE COCCI IN PAIRS RARE GRAM POSITIVE RODS Performed at [facility], [address] [place], [place] [address]    Culture PENDING    Report Status PENDING   Vancomycin, trough     Status: Abnormal   Collection Time: 04/06/18 10:11 PM  Result Value Ref Range   Vancomycin Tr 13 (L) 15 - 20 ug/mL  CBC with Differential/Platelet     Status: Abnormal   Collection Time: 04/06/18 10:11 PM  Result Value Ref Range   WBC 12.7 (H) 3.8 - 10.6 K/uL   RBC 5.59 4.40 - 5.90 MIL/uL   Hemoglobin 15.6 13.0 - 18.0 g/dL   HCT 46.946.6 62.940.0 - 52.852.0 %   MCV 83.4 80.0 - 100.0 fL   MCH 27.8 26.0 - 34.0 pg   MCHC 33.4 32.0 - 36.0 g/dL   RDW 41.314.3 24.411.5 - 01.014.5 %   Platelets  373 150 - 440 K/uL   Neutrophils Relative % 83 %   Neutro Abs 10.7 (H) 1.4 - 6.5 K/uL   Lymphocytes Relative 10 %   Lymphs Abs 1.3 1.0 - 3.6 K/uL   Monocytes Relative 4 %   Monocytes Absolute 0.4 0.2 - 1.0 K/uL   Eosinophils Relative 2 %   Eosinophils Absolute 0.2 0 - 0.7 K/uL   Basophils Relative 1 %   Basophils Absolute 0.1 0 - 0.1 K/uL  Sedimentation rate     Status: None   Collection Time: 04/07/18  6:14 AM  Result Value Ref Range   Sed Rate 13 0 - 20 mm/hr  C-reactive protein     Status: None   Collection Time: 04/07/18  6:14 AM  Result Value Ref Range   CRP <0.8 <1.0 mg/dL    [facility]  Result Date: 04/06/2018 If Site Rite image not attached, placement could not be confirmed due to current cardiac rhythm.   Assessment/Plan: 1 Day Post-Op   Active Problems:   Cellulitis and abscess of hand  Discussed case with Dr. Nemiah CommanderKalisetti.  Patient has PICC line and will continue on IV antibiotics per Dr. Sampson Goon.  Patient will follow up with Dr. Odis Luster in our office on Tuesday for wound check.  Patient will keep the bandage on his left hand until follow up.  Keep bandage clean and dry.  Cover bandages during showers.    Juanell Fairly , MD 04/07/2018, 3:28 PM

## 2018-04-07 NOTE — Progress Notes (Signed)
PHARMACY CONSULT NOTE FOR:  OUTPATIENT  PARENTERAL ANTIBIOTIC THERAPY (OPAT)  Indication: Osteomyelitis  Regimen: Vancomycin 2g IV q12h  End date: 05/03/18   IV antibiotic discharge orders are pended. To discharging provider:  please sign these orders via discharge navigator,  Select New Orders & click on the button choice - Manage This Unsigned Work.     Thank you for allowing pharmacy to be a part of this patient's care.  Cleopatra CedarStephanie Ewald Beg, PharmD Pharmacy Resident  04/07/2018, 11:46 AM

## 2018-04-07 NOTE — Progress Notes (Signed)
Peripherally Inserted Central Catheter/Midline Placement  The IV Nurse has discussed with the patient and/or persons authorized to consent for the patient, the purpose of this procedure and the potential benefits and risks involved with this procedure.  The benefits include less needle sticks, lab draws from the catheter, and the patient may be discharged home with the catheter. Risks include, but not limited to, infection, bleeding, blood clot (thrombus formation), and puncture of an artery; nerve damage and irregular heartbeat and possibility to perform a PICC exchange if needed/ordered by physician.  Alternatives to this procedure were also discussed.  Bard Power PICC patient education guide, fact sheet on infection prevention and patient information card has been provided to patient /or left at bedside.    PICC/Midline Placement Documentation  PICC Single Lumen 04/07/18 PICC Right Basilic 44 cm 1 cm (Active)  Indication for Insertion or Continuance of Line Home intravenous therapies (PICC only) 04/07/2018  9:44 AM  Exposed Catheter (cm) 1 cm 04/07/2018  9:44 AM  Site Assessment Clean;Dry;Intact 04/07/2018  9:44 AM  Line Status Flushed;Saline locked;Blood return noted 04/07/2018  9:44 AM  Dressing Type Transparent 04/07/2018  9:44 AM  Dressing Status Clean;Intact;Dry;Antimicrobial disc in place 04/07/2018  9:44 AM  Line Care Connections checked and tightened 04/07/2018  9:44 AM  Line Adjustment (NICU/IV Team Only) No 04/07/2018  9:44 AM  Dressing Intervention New dressing 04/07/2018  9:44 AM  Dressing Change Due 04/14/18 04/07/2018  9:44 AM       Elliot Dallyiggs, [place] Alipio Wright 04/07/2018, 9:44 AM

## 2018-04-07 NOTE — Care Management Note (Addendum)
Case Management Note  Patient Details  Name: Manuel Gregory MRN: [id number] Date of Birth: 08/17/1961  Subjective/Objective:     Patient to be discharged on Iv abx [facility] Spoke with patient who said his brother will be available to help him at discharge with medication administration. Patient has not used HH in the past and given choice would prefer to use [facility]. RNCM spoke with Vaughan BastaJermaine and gave him the referral. Patient to become established with a PCP early next week.               Action/Plan: RNCM signed off   Expected Discharge Date:  04/07/18               Expected Discharge Plan:  Home w Home Health Services  In-House Referral:     Discharge planning Services  CM Consult  Post Acute Care Choice:  Durable Medical Equipment, Home Health Choice offered to:  Patient  DME Arranged:  IV pump/equipment DME Agency:  [facility]  [facility]  Status of Service:  Completed, signed off  If discussed at Long Length of Stay Meetings, dates discussed:    Additional Comments:  Virgel ManifoldJosh A Litzi Binning, RN 04/07/2018, 3:52 PM

## 2018-04-08 LAB — AEROBIC CULTURE W GRAM STAIN (SUPERFICIAL SPECIMEN)
Culture: NORMAL
Special Requests: NORMAL

## 2018-04-09 ENCOUNTER — Encounter: Payer: Self-pay | Admitting: Orthopedic Surgery

## 2018-04-09 ENCOUNTER — Telehealth: Payer: Self-pay | Admitting: Infectious Diseases

## 2018-04-09 NOTE — Anesthesia Postprocedure Evaluation (Signed)
Anesthesia Post Note  Patient: Manuel Gregory  Procedure(s) Performed: INCISION AND DRAINAGE LEFT HAND (Left Hand)  Patient location during evaluation: PACU Anesthesia Type: General Level of consciousness: awake and alert Pain management: pain level controlled Vital Signs Assessment: post-procedure vital signs reviewed and stable Respiratory status: spontaneous breathing, nonlabored ventilation, respiratory function stable and patient connected to nasal cannula oxygen Cardiovascular status: blood pressure returned to baseline and stable Postop Assessment: no apparent nausea or vomiting Anesthetic complications: no     Last Vitals:  Vitals:   04/07/18 0531 04/07/18 1404  BP: (!) 141/90 137/84  Pulse: (!) 58 63  Resp: 20   Temp: (!) 36.3 C 36.8 C  SpO2: 96% 96%    Last Pain:  Vitals:   04/07/18 1404  TempSrc: Oral  PainSc:                  Lenard SimmerAndrew Keldric Poyer

## 2018-04-09 NOTE — Care Management (Addendum)
Patient set up this weekend with IV abx in the home setting. Currently without PCP. RNCM contacted Dr Tilda FrancoMasouds office and was able to establish an initial appointment for 04/10/2018 at 1115. Patient notified of appointment.

## 2018-04-09 NOTE — Progress Notes (Signed)
Steward DroneBrenda case Manager that covers 2C today was notified with patient's situation regarding the need of medical doctor to sign the plan of care for Home Health care. [facility] was given to [facility] the Sports coachcase manager.

## 2018-04-10 LAB — CULTURE, BLOOD (ROUTINE X 2)
Culture: NO GROWTH
Culture: NO GROWTH
Special Requests: ADEQUATE

## 2018-04-11 LAB — AEROBIC/ANAEROBIC CULTURE (SURGICAL/DEEP WOUND)

## 2018-04-11 LAB — AEROBIC/ANAEROBIC CULTURE W GRAM STAIN (SURGICAL/DEEP WOUND): Culture: NORMAL

## 2018-04-12 NOTE — Telephone Encounter (Signed)
erroe

## 2018-05-30 ENCOUNTER — Emergency Department: Payer: Medicare Other

## 2018-05-30 ENCOUNTER — Encounter: Payer: Self-pay | Admitting: Emergency Medicine

## 2018-05-30 ENCOUNTER — Other Ambulatory Visit: Payer: Self-pay

## 2018-05-30 ENCOUNTER — Emergency Department
Admission: EM | Admit: 2018-05-30 | Discharge: 2018-05-30 | Disposition: A | Discharge status: Home / Self Care | Payer: Medicare Other | Attending: Emergency Medicine | Admitting: Emergency Medicine

## 2018-05-30 DIAGNOSIS — Z23 Encounter for immunization: Secondary | ICD-10-CM | POA: Diagnosis not present

## 2018-05-30 DIAGNOSIS — Y999 Unspecified external cause status: Secondary | ICD-10-CM | POA: Diagnosis not present

## 2018-05-30 DIAGNOSIS — Y929 Unspecified place or not applicable: Secondary | ICD-10-CM | POA: Insufficient documentation

## 2018-05-30 DIAGNOSIS — F17210 Nicotine dependence, cigarettes, uncomplicated: Secondary | ICD-10-CM | POA: Insufficient documentation

## 2018-05-30 DIAGNOSIS — I10 Essential (primary) hypertension: Secondary | ICD-10-CM | POA: Insufficient documentation

## 2018-05-30 DIAGNOSIS — S3992XA Unspecified injury of lower back, initial encounter: Secondary | ICD-10-CM | POA: Diagnosis present

## 2018-05-30 DIAGNOSIS — T148XXA Other injury of unspecified body region, initial encounter: Secondary | ICD-10-CM

## 2018-05-30 DIAGNOSIS — Z79899 Other long term (current) drug therapy: Secondary | ICD-10-CM | POA: Diagnosis not present

## 2018-05-30 DIAGNOSIS — L03312 Cellulitis of back [any part except buttock]: Secondary | ICD-10-CM | POA: Diagnosis not present

## 2018-05-30 DIAGNOSIS — S20419A Abrasion of unspecified back wall of thorax, initial encounter: Secondary | ICD-10-CM | POA: Insufficient documentation

## 2018-05-30 DIAGNOSIS — S32009A Unspecified fracture of unspecified lumbar vertebra, initial encounter for closed fracture: Secondary | ICD-10-CM | POA: Insufficient documentation

## 2018-05-30 DIAGNOSIS — Y939 Activity, unspecified: Secondary | ICD-10-CM | POA: Insufficient documentation

## 2018-05-30 DIAGNOSIS — S0990XA Unspecified injury of head, initial encounter: Secondary | ICD-10-CM | POA: Insufficient documentation

## 2018-05-30 LAB — CBC WITH DIFFERENTIAL/PLATELET
Basophils Absolute: 0.1 10*3/uL (ref 0–0.1)
Basophils Relative: 0 %
EOS PCT: 1 %
Eosinophils Absolute: 0.1 10*3/uL (ref 0–0.7)
HCT: 37.3 % — ABNORMAL LOW (ref 40.0–52.0)
Hemoglobin: 12.6 g/dL — ABNORMAL LOW (ref 13.0–18.0)
LYMPHS ABS: 2.8 10*3/uL (ref 1.0–3.6)
Lymphocytes Relative: 20 %
MCH: 28.1 pg (ref 26.0–34.0)
MCHC: 33.8 g/dL (ref 32.0–36.0)
MCV: 82.9 fL (ref 80.0–100.0)
Monocytes Absolute: 1.5 10*3/uL — ABNORMAL HIGH (ref 0.2–1.0)
Monocytes Relative: 11 %
Neutro Abs: 9.3 10*3/uL — ABNORMAL HIGH (ref 1.4–6.5)
Neutrophils Relative %: 68 %
PLATELETS: 202 10*3/uL (ref 150–440)
RBC: 4.5 MIL/uL (ref 4.40–5.90)
RDW: 14.4 % (ref 11.5–14.5)
WBC: 13.7 10*3/uL — ABNORMAL HIGH (ref 3.8–10.6)

## 2018-05-30 LAB — COMPREHENSIVE METABOLIC PANEL
ALK PHOS: 45 U/L (ref 38–126)
ALT: 23 U/L (ref 0–44)
AST: 27 U/L (ref 15–41)
Albumin: 4.2 g/dL (ref 3.5–5.0)
Anion gap: 9 (ref 5–15)
BUN: 25 mg/dL — ABNORMAL HIGH (ref 6–20)
CALCIUM: 9.1 mg/dL (ref 8.9–10.3)
CHLORIDE: 97 mmol/L — AB (ref 98–111)
CO2: 29 mmol/L (ref 22–32)
CREATININE: 0.99 mg/dL (ref 0.61–1.24)
Glucose, Bld: 105 mg/dL — ABNORMAL HIGH (ref 70–99)
Potassium: 3.3 mmol/L — ABNORMAL LOW (ref 3.5–5.1)
Sodium: 135 mmol/L (ref 135–145)
TOTAL PROTEIN: 7.3 g/dL (ref 6.5–8.1)
Total Bilirubin: 1.4 mg/dL — ABNORMAL HIGH (ref 0.3–1.2)

## 2018-05-30 LAB — URINE DRUG SCREEN, QUALITATIVE (ARMC ONLY)
Amphetamines, Ur Screen: NOT DETECTED
BARBITURATES, UR SCREEN: NOT DETECTED
BENZODIAZEPINE, UR SCRN: POSITIVE — AB
Cannabinoid 50 Ng, Ur ~~LOC~~: NOT DETECTED
Cocaine Metabolite,Ur ~~LOC~~: POSITIVE — AB
MDMA (Ecstasy)Ur Screen: NOT DETECTED
METHADONE SCREEN, URINE: NOT DETECTED
Opiate, Ur Screen: NOT DETECTED
Phencyclidine (PCP) Ur S: NOT DETECTED
Tricyclic, Ur Screen: NOT DETECTED

## 2018-05-30 MED ORDER — OXYCODONE-ACETAMINOPHEN 5-325 MG PO TABS: 1.0000 | ORAL_TABLET | ORAL | 0 refills | Status: AC | PRN

## 2018-05-30 MED ORDER — TETANUS-DIPHTH-ACELL PERTUSSIS 5-2.5-18.5 LF-MCG/0.5 IM SUSP
0.5000 mL | Freq: Once | INTRAMUSCULAR | Status: AC
  Administered 2018-05-30: 0.5 mL via INTRAMUSCULAR
  Filled 2018-05-30: qty 0.5

## 2018-05-30 MED ORDER — KETOROLAC TROMETHAMINE 30 MG/ML IJ SOLN
10.0000 mg | Freq: Once | INTRAMUSCULAR | Status: AC
Start: 2018-05-30 — End: 2018-05-30
  Administered 2018-05-30: 9.9 mg via INTRAVENOUS
  Filled 2018-05-30: qty 1

## 2018-05-30 MED ORDER — CEPHALEXIN 500 MG PO CAPS: 500.0000 mg | ORAL_CAPSULE | Freq: Three times a day (TID) | ORAL | 0 refills | Status: AC

## 2018-05-30 MED ORDER — IOPAMIDOL (ISOVUE-370) INJECTION 76%
100.0000 mL | Freq: Once | INTRAVENOUS | Status: AC | PRN
  Administered 2018-05-30: 100 mL via INTRAVENOUS

## 2018-05-30 MED ORDER — CEFAZOLIN SODIUM-DEXTROSE 1-4 GM/50ML-% IV SOLN
1.0000 g | Freq: Once | INTRAVENOUS | Status: AC
  Administered 2018-05-30: 1 g via INTRAVENOUS
  Filled 2018-05-30: qty 50

## 2018-05-30 MED ORDER — IBUPROFEN 800 MG PO TABS: 800.0000 mg | ORAL_TABLET | Freq: Three times a day (TID) | ORAL | 0 refills | Status: AC | PRN

## 2018-05-30 MED ORDER — SODIUM CHLORIDE 0.9 % IV BOLUS
1000.0000 mL | Freq: Once | INTRAVENOUS | Status: AC
  Administered 2018-05-30: 1000 mL via INTRAVENOUS

## 2018-05-30 MED ORDER — OXYCODONE-ACETAMINOPHEN 5-325 MG PO TABS
1.0000 | ORAL_TABLET | Freq: Once | ORAL | Status: AC
  Administered 2018-05-30: 1 via ORAL
  Filled 2018-05-30: qty 1

## 2018-05-30 NOTE — ED Notes (Signed)
Pt to CT
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
## 2018-05-30 NOTE — Discharge Instructions (Signed)
1.  Take antibiotic as prescribed (Keflex 500 mg 3 times daily x7 days). 2.  You may take pain medicines as needed (Motrin/Percocet #15). 3.  Keep wound clean and dry. 4.  Apply abdominal binder while awake. 5.  Return to the ER for worsening symptoms, persistent vomiting, difficulty breathing, extremity weakness, numbness/tingling, or other concerns.
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
